# Patient Record
Sex: Male | Born: 1940
Health system: Southern US, Community
[De-identification: ages and names within clinical notes are randomized; demographics above are authoritative.]

## PROBLEM LIST (undated history)

## (undated) DIAGNOSIS — K759 Inflammatory liver disease, unspecified: Secondary | ICD-10-CM

## (undated) DIAGNOSIS — M1990 Unspecified osteoarthritis, unspecified site: Secondary | ICD-10-CM

## (undated) DIAGNOSIS — N281 Cyst of kidney, acquired: Secondary | ICD-10-CM

## (undated) DIAGNOSIS — I10 Essential (primary) hypertension: Secondary | ICD-10-CM

## (undated) DIAGNOSIS — R011 Cardiac murmur, unspecified: Secondary | ICD-10-CM

## (undated) DIAGNOSIS — L509 Urticaria, unspecified: Secondary | ICD-10-CM

## (undated) DIAGNOSIS — K8020 Calculus of gallbladder without cholecystitis without obstruction: Secondary | ICD-10-CM

## (undated) DIAGNOSIS — E039 Hypothyroidism, unspecified: Secondary | ICD-10-CM

## (undated) DIAGNOSIS — Z87442 Personal history of urinary calculi: Secondary | ICD-10-CM

## (undated) DIAGNOSIS — I2510 Atherosclerotic heart disease of native coronary artery without angina pectoris: Secondary | ICD-10-CM

## (undated) DIAGNOSIS — E785 Hyperlipidemia, unspecified: Secondary | ICD-10-CM

## (undated) DIAGNOSIS — K219 Gastro-esophageal reflux disease without esophagitis: Secondary | ICD-10-CM

## (undated) DIAGNOSIS — C9110 Chronic lymphocytic leukemia of B-cell type not having achieved remission: Secondary | ICD-10-CM

## (undated) DIAGNOSIS — K449 Diaphragmatic hernia without obstruction or gangrene: Secondary | ICD-10-CM

## (undated) HISTORY — PX: BACK SURGERY: SHX140

## (undated) HISTORY — DX: Urticaria, unspecified: L50.9

## (undated) HISTORY — DX: Diaphragmatic hernia without obstruction or gangrene: K44.9

## (undated) HISTORY — DX: Calculus of gallbladder without cholecystitis without obstruction: K80.20

## (undated) HISTORY — PX: APPENDECTOMY: SHX54

## (undated) HISTORY — DX: Hyperlipidemia, unspecified: E78.5

---

## 1963-10-02 HISTORY — PX: PILONIDAL CYST EXCISION: SHX744

## 1966-10-01 HISTORY — PX: INCISION AND DRAINAGE ABSCESS ANAL: SUR669

## 2015-05-03 ENCOUNTER — Other Ambulatory Visit: Payer: Self-pay | Admitting: Neurosurgery

## 2015-05-05 ENCOUNTER — Other Ambulatory Visit: Payer: Self-pay | Admitting: Neurosurgery

## 2015-05-05 DIAGNOSIS — N281 Cyst of kidney, acquired: Secondary | ICD-10-CM

## 2015-05-06 ENCOUNTER — Ambulatory Visit
Admission: RE | Admit: 2015-05-06 | Discharge: 2015-05-06 | Disposition: A | Discharge status: Home / Self Care | Payer: Medicare HMO | Source: Ambulatory Visit | Attending: Neurosurgery | Admitting: Neurosurgery

## 2015-05-06 DIAGNOSIS — N281 Cyst of kidney, acquired: Secondary | ICD-10-CM

## 2015-05-09 ENCOUNTER — Encounter (HOSPITAL_COMMUNITY): Payer: Self-pay

## 2015-05-09 ENCOUNTER — Encounter (HOSPITAL_COMMUNITY)
Admission: RE | Admit: 2015-05-09 | Discharge: 2015-05-09 | Disposition: A | Discharge status: Home / Self Care | Payer: Medicare HMO | Source: Ambulatory Visit | Attending: Neurosurgery | Admitting: Neurosurgery

## 2015-05-09 DIAGNOSIS — Z01812 Encounter for preprocedural laboratory examination: Secondary | ICD-10-CM | POA: Diagnosis present

## 2015-05-09 DIAGNOSIS — M4806 Spinal stenosis, lumbar region: Secondary | ICD-10-CM | POA: Insufficient documentation

## 2015-05-09 HISTORY — DX: Essential (primary) hypertension: I10

## 2015-05-09 HISTORY — DX: Cyst of kidney, acquired: N28.1

## 2015-05-09 HISTORY — DX: Hypothyroidism, unspecified: E03.9

## 2015-05-09 HISTORY — DX: Cardiac murmur, unspecified: R01.1

## 2015-05-09 HISTORY — DX: Unspecified osteoarthritis, unspecified site: M19.90

## 2015-05-09 HISTORY — DX: Gastro-esophageal reflux disease without esophagitis: K21.9

## 2015-05-09 HISTORY — DX: Inflammatory liver disease, unspecified: K75.9

## 2015-05-09 LAB — CBC
HEMATOCRIT: 39.7 % (ref 39.0–52.0)
HEMOGLOBIN: 13.6 g/dL (ref 13.0–17.0)
MCH: 30.3 pg (ref 26.0–34.0)
MCHC: 34.3 g/dL (ref 30.0–36.0)
MCV: 88.4 fL (ref 78.0–100.0)
PLATELETS: 216 10*3/uL (ref 150–400)
RBC: 4.49 MIL/uL (ref 4.22–5.81)
RDW: 14 % (ref 11.5–15.5)
WBC: 12.2 10*3/uL — ABNORMAL HIGH (ref 4.0–10.5)

## 2015-05-09 LAB — SURGICAL PCR SCREEN
MRSA, PCR: NEGATIVE
Staphylococcus aureus: NEGATIVE

## 2015-05-09 LAB — BASIC METABOLIC PANEL
Anion gap: 9 (ref 5–15)
BUN: 10 mg/dL (ref 6–20)
CALCIUM: 9.7 mg/dL (ref 8.9–10.3)
CHLORIDE: 104 mmol/L (ref 101–111)
CO2: 26 mmol/L (ref 22–32)
Creatinine, Ser: 1.07 mg/dL (ref 0.61–1.24)
GFR calc Af Amer: >60 mL/min (ref >60)
GLUCOSE: 103 mg/dL — AB (ref 65–99)
POTASSIUM: 4.3 mmol/L (ref 3.5–5.1)
SODIUM: 139 mmol/L (ref 135–145)

## 2015-05-09 NOTE — Progress Notes (Signed)
Sending sleep apnea scoring to Dr. Garlon Hatchet, also requesting EKG that pt. Reports was done in their office 2-3 months ago.

## 2015-05-09 NOTE — Progress Notes (Signed)
   05/09/15 1331  OBSTRUCTIVE SLEEP APNEA  Have you ever been diagnosed with sleep apnea through a sleep study? No  Do you snore loudly (loud enough to be heard through closed doors)?  1  Do you often feel tired, fatigued, or sleepy during the daytime? 0  Has anyone observed you stop breathing during your sleep? 0  Do you have, or are you being treated for high blood pressure? 1  BMI more than 35 kg/m2? 0  Age over 73 years old? 1  Neck circumference greater than 40 cm/16 inches? 0  Gender: 1

## 2015-05-09 NOTE — Pre-Procedure Instructions (Signed)
James Hammond  05/09/2015      Your procedure is scheduled on August 12.  Report to [facility] Admitting at 5:30 A.M.  Call this number if you have problems the morning of surgery:  [phone]   Remember:  Do not eat food or drink liquids after midnight.  Take these medicines the morning of surgery with A SIP OF WATER Gabapentin,    STOP aspirin, ibuprofen today   STOP/ Do not take Aspirin, Aleve, Naproxen, Advil, Ibuprofen, Motrin, Vitamins, Herbs, or Supplements starting today   Do not wear jewelry, make-up or nail polish.  Do not wear lotions, powders, or perfumes.  You may wear deodorant.  Do not shave 48 hours prior to surgery.  Men may shave face and neck.  Do not bring valuables to the hospital.  [facility] is not responsible for any belongings or valuables.  Contacts, dentures or bridgework may not be worn into surgery.  Leave your suitcase in the car.  After surgery it may be brought to your room.  For patients admitted to the hospital, discharge time will be determined by your treatment team.  [facility] - Preparing for Surgery  Before surgery, you can play an important role.  Because skin is not sterile, your skin needs to be as free of germs as possible.  You can reduce the number of germs on you skin by washing with CHG (chlorahexidine gluconate) soap before surgery.  CHG is an antiseptic cleaner which kills germs and bonds with the skin to continue killing germs even after washing.  Please DO NOT use if you have an allergy to CHG or antibacterial soaps.  If your skin becomes reddened/irritated stop using the CHG and inform your nurse when you arrive at Short Stay.  Do not shave (including legs and underarms) for at least 48 hours prior to the first CHG shower.  You may shave your face.  Please follow these instructions carefully:   1.  Shower with CHG Soap the night before surgery and the morning of Surgery.  2.  If you choose to wash your  hair, wash your hair first as usual with your normal shampoo.  3.  After you shampoo, rinse your hair and body thoroughly to remove the shampoo.  4.  Use CHG as you would any other liquid soap.  You can apply CHG directly to the skin and wash gently with scrungie or a clean washcloth.  5.  Apply the CHG Soap to your body ONLY FROM THE NECK DOWN.  Do not use on open wounds or open sores.  Avoid contact with your eyes, ears, mouth and genitals (private parts).  Wash genitals (private parts) with your normal soap.  6.  Wash thoroughly, paying special attention to the area where your surgery will be performed.  7.  Thoroughly rinse your body with warm water from the neck down.  8.  DO NOT shower/wash with your normal soap after using and rinsing off the CHG Soap.  9.  Pat yourself dry with a clean towel.            10.  Wear clean pajamas.            11.  Place clean sheets on your bed the night of your first shower and do not sleep with pets.  Day of Surgery  Do not apply any lotions the morning of surgery.  Please wear clean clothes to the hospital/surgery center.  Please read over the following fact sheets that you were given. Pain Booklet, Coughing and Deep Breathing and Surgical Site Infection Prevention

## 2015-05-12 MED ORDER — CEFAZOLIN SODIUM-DEXTROSE 2-3 GM-% IV SOLR
2.0000 g | INTRAVENOUS | Status: AC
  Administered 2015-05-13: 2 g via INTRAVENOUS
  Filled 2015-05-12: qty 50

## 2015-05-12 MED ORDER — DEXAMETHASONE SODIUM PHOSPHATE 10 MG/ML IJ SOLN
10.0000 mg | INTRAMUSCULAR | Status: AC
  Administered 2015-05-13: 10 mg via INTRAVENOUS
  Filled 2015-05-12: qty 1

## 2015-05-13 ENCOUNTER — Inpatient Hospital Stay (HOSPITAL_COMMUNITY)
Admission: RE | Admit: 2015-05-13 | Discharge: 2015-05-13 | DRG: 520 | Disposition: A | Discharge status: Home / Self Care | Payer: Medicare HMO | Source: Ambulatory Visit | Attending: Neurosurgery | Admitting: Neurosurgery

## 2015-05-13 ENCOUNTER — Inpatient Hospital Stay (HOSPITAL_COMMUNITY): Payer: Medicare HMO

## 2015-05-13 ENCOUNTER — Encounter (HOSPITAL_COMMUNITY): Payer: Self-pay | Admitting: Certified Registered Nurse Anesthetist

## 2015-05-13 ENCOUNTER — Inpatient Hospital Stay (HOSPITAL_COMMUNITY): Payer: Medicare HMO | Admitting: Anesthesiology

## 2015-05-13 ENCOUNTER — Encounter (HOSPITAL_COMMUNITY)
Admission: RE | Disposition: A | Discharge status: Home / Self Care | Payer: Self-pay | Source: Ambulatory Visit | Attending: Neurosurgery

## 2015-05-13 DIAGNOSIS — Z7982 Long term (current) use of aspirin: Secondary | ICD-10-CM

## 2015-05-13 DIAGNOSIS — Z87891 Personal history of nicotine dependence: Secondary | ICD-10-CM | POA: Diagnosis not present

## 2015-05-13 DIAGNOSIS — M4690 Unspecified inflammatory spondylopathy, site unspecified: Secondary | ICD-10-CM | POA: Diagnosis present

## 2015-05-13 DIAGNOSIS — R011 Cardiac murmur, unspecified: Secondary | ICD-10-CM | POA: Diagnosis present

## 2015-05-13 DIAGNOSIS — M5126 Other intervertebral disc displacement, lumbar region: Secondary | ICD-10-CM | POA: Diagnosis present

## 2015-05-13 DIAGNOSIS — E039 Hypothyroidism, unspecified: Secondary | ICD-10-CM | POA: Diagnosis present

## 2015-05-13 DIAGNOSIS — K219 Gastro-esophageal reflux disease without esophagitis: Secondary | ICD-10-CM | POA: Diagnosis present

## 2015-05-13 DIAGNOSIS — M5116 Intervertebral disc disorders with radiculopathy, lumbar region: Secondary | ICD-10-CM | POA: Diagnosis present

## 2015-05-13 DIAGNOSIS — Z79899 Other long term (current) drug therapy: Secondary | ICD-10-CM

## 2015-05-13 DIAGNOSIS — I10 Essential (primary) hypertension: Secondary | ICD-10-CM | POA: Diagnosis present

## 2015-05-13 DIAGNOSIS — M4806 Spinal stenosis, lumbar region: Secondary | ICD-10-CM | POA: Diagnosis present

## 2015-05-13 DIAGNOSIS — Z419 Encounter for procedure for purposes other than remedying health state, unspecified: Secondary | ICD-10-CM

## 2015-05-13 HISTORY — DX: Other intervertebral disc displacement, lumbar region: M51.26

## 2015-05-13 HISTORY — PX: LUMBAR LAMINECTOMY/DECOMPRESSION MICRODISCECTOMY: SHX5026

## 2015-05-13 SURGERY — LUMBAR LAMINECTOMY/DECOMPRESSION MICRODISCECTOMY 2 LEVELS
Anesthesia: General | Site: Back | Laterality: Right

## 2015-05-13 MED ORDER — SODIUM CHLORIDE 0.9 % IJ SOLN: 3.0000 mL | Freq: Two times a day (BID) | INTRAMUSCULAR | Status: DC

## 2015-05-13 MED ORDER — PROPOFOL 10 MG/ML IV BOLUS
INTRAVENOUS | Status: AC
  Filled 2015-05-13: qty 20

## 2015-05-13 MED ORDER — FAMOTIDINE 10 MG PO CHEW: 10.0000 mg | CHEWABLE_TABLET | Freq: Two times a day (BID) | ORAL | Status: DC | PRN

## 2015-05-13 MED ORDER — BUPIVACAINE HCL (PF) 0.25 % IJ SOLN
INTRAMUSCULAR | Status: DC | PRN
  Administered 2015-05-13: 10 mL

## 2015-05-13 MED ORDER — ARTIFICIAL TEARS OP OINT
TOPICAL_OINTMENT | OPHTHALMIC | Status: DC | PRN
  Administered 2015-05-13: 1 via OPHTHALMIC

## 2015-05-13 MED ORDER — SODIUM CHLORIDE 0.9 % IR SOLN
Status: DC | PRN
  Administered 2015-05-13: 07:00:00

## 2015-05-13 MED ORDER — ASPIRIN 81 MG PO CHEW: 81.0000 mg | CHEWABLE_TABLET | Freq: Every day | ORAL | Status: DC

## 2015-05-13 MED ORDER — SUGAMMADEX SODIUM 200 MG/2ML IV SOLN
INTRAVENOUS | Status: AC
  Filled 2015-05-13: qty 2

## 2015-05-13 MED ORDER — FENTANYL CITRATE (PF) 100 MCG/2ML IJ SOLN: 25.0000 ug | INTRAMUSCULAR | Status: DC | PRN

## 2015-05-13 MED ORDER — ACETAMINOPHEN 325 MG PO TABS: 650.0000 mg | ORAL_TABLET | ORAL | Status: DC | PRN

## 2015-05-13 MED ORDER — ONDANSETRON HCL 4 MG/2ML IJ SOLN
INTRAMUSCULAR | Status: DC | PRN
  Administered 2015-05-13: 4 mg via INTRAVENOUS

## 2015-05-13 MED ORDER — PROPOFOL 10 MG/ML IV BOLUS
INTRAVENOUS | Status: DC | PRN
  Administered 2015-05-13: 100 mg via INTRAVENOUS

## 2015-05-13 MED ORDER — ACETAMINOPHEN 650 MG RE SUPP: 650.0000 mg | RECTAL | Status: DC | PRN

## 2015-05-13 MED ORDER — PHENOL 1.4 % MT LIQD: 1.0000 | OROMUCOSAL | Status: DC | PRN

## 2015-05-13 MED ORDER — THROMBIN 5000 UNITS EX SOLR
CUTANEOUS | Status: DC | PRN
  Administered 2015-05-13 (×2): 5000 [IU] via TOPICAL

## 2015-05-13 MED ORDER — 0.9 % SODIUM CHLORIDE (POUR BTL) OPTIME
TOPICAL | Status: DC | PRN
  Administered 2015-05-13: 1000 mL

## 2015-05-13 MED ORDER — CEFAZOLIN SODIUM 1-5 GM-% IV SOLN
1.0000 g | Freq: Three times a day (TID) | INTRAVENOUS | Status: DC
  Administered 2015-05-13: 1 g via INTRAVENOUS
  Filled 2015-05-13 (×2): qty 50

## 2015-05-13 MED ORDER — SODIUM CHLORIDE 0.9 % IJ SOLN: 3.0000 mL | INTRAMUSCULAR | Status: DC | PRN

## 2015-05-13 MED ORDER — CYCLOBENZAPRINE HCL 10 MG PO TABS: 10.0000 mg | ORAL_TABLET | Freq: Three times a day (TID) | ORAL | Status: DC | PRN

## 2015-05-13 MED ORDER — ROCURONIUM BROMIDE 100 MG/10ML IV SOLN
INTRAVENOUS | Status: DC | PRN
  Administered 2015-05-13: 50 mg via INTRAVENOUS

## 2015-05-13 MED ORDER — IRBESARTAN 75 MG PO TABS
37.5000 mg | ORAL_TABLET | Freq: Every day | ORAL | Status: DC
  Filled 2015-05-13 (×2): qty 0.5

## 2015-05-13 MED ORDER — SODIUM CHLORIDE 0.9 % IJ SOLN
INTRAMUSCULAR | Status: AC
  Filled 2015-05-13: qty 40

## 2015-05-13 MED ORDER — EPHEDRINE SULFATE 50 MG/ML IJ SOLN
INTRAMUSCULAR | Status: DC | PRN
  Administered 2015-05-13: 5 mg via INTRAVENOUS
  Administered 2015-05-13: 10 mg via INTRAVENOUS

## 2015-05-13 MED ORDER — MENTHOL 3 MG MT LOZG: 1.0000 | LOZENGE | OROMUCOSAL | Status: DC | PRN

## 2015-05-13 MED ORDER — LIDOCAINE HCL (CARDIAC) 20 MG/ML IV SOLN
INTRAVENOUS | Status: DC | PRN
  Administered 2015-05-13: 100 mg via INTRAVENOUS

## 2015-05-13 MED ORDER — OXYCODONE-ACETAMINOPHEN 5-325 MG PO TABS: 1.0000 | ORAL_TABLET | ORAL | Status: DC | PRN

## 2015-05-13 MED ORDER — ONDANSETRON HCL 4 MG/2ML IJ SOLN: 4.0000 mg | Freq: Once | INTRAMUSCULAR | Status: DC | PRN

## 2015-05-13 MED ORDER — SUCCINYLCHOLINE CHLORIDE 20 MG/ML IJ SOLN
INTRAMUSCULAR | Status: AC
  Filled 2015-05-13: qty 2

## 2015-05-13 MED ORDER — IBUPROFEN 200 MG PO TABS: 400.0000 mg | ORAL_TABLET | Freq: Three times a day (TID) | ORAL | Status: DC | PRN

## 2015-05-13 MED ORDER — ONDANSETRON HCL 4 MG/2ML IJ SOLN: 4.0000 mg | INTRAMUSCULAR | Status: DC | PRN

## 2015-05-13 MED ORDER — LACTATED RINGERS IV SOLN
INTRAVENOUS | Status: DC | PRN
  Administered 2015-05-13 (×2): via INTRAVENOUS

## 2015-05-13 MED ORDER — HYDROMORPHONE HCL 1 MG/ML IJ SOLN: 0.5000 mg | INTRAMUSCULAR | Status: DC | PRN

## 2015-05-13 MED ORDER — ROCURONIUM BROMIDE 50 MG/5ML IV SOLN
INTRAVENOUS | Status: AC
  Filled 2015-05-13: qty 3

## 2015-05-13 MED ORDER — FENTANYL CITRATE (PF) 100 MCG/2ML IJ SOLN
INTRAMUSCULAR | Status: DC | PRN
  Administered 2015-05-13: 100 ug via INTRAVENOUS
  Administered 2015-05-13 (×3): 50 ug via INTRAVENOUS

## 2015-05-13 MED ORDER — GABAPENTIN 600 MG PO TABS: 600.0000 mg | ORAL_TABLET | Freq: Every day | ORAL | Status: DC

## 2015-05-13 MED ORDER — EPHEDRINE SULFATE 50 MG/ML IJ SOLN
INTRAMUSCULAR | Status: AC
  Filled 2015-05-13: qty 2

## 2015-05-13 MED ORDER — SUGAMMADEX SODIUM 500 MG/5ML IV SOLN
INTRAVENOUS | Status: DC | PRN
  Administered 2015-05-13: 185 mg via INTRAVENOUS

## 2015-05-13 MED ORDER — MIDAZOLAM HCL 2 MG/2ML IJ SOLN
INTRAMUSCULAR | Status: AC
  Filled 2015-05-13: qty 4

## 2015-05-13 MED ORDER — FAMOTIDINE 20 MG PO TABS: 20.0000 mg | ORAL_TABLET | Freq: Two times a day (BID) | ORAL | Status: DC | PRN

## 2015-05-13 MED ORDER — FENTANYL CITRATE (PF) 250 MCG/5ML IJ SOLN
INTRAMUSCULAR | Status: AC
  Filled 2015-05-13: qty 5

## 2015-05-13 MED ORDER — HEMOSTATIC AGENTS (NO CHARGE) OPTIME
TOPICAL | Status: DC | PRN
Start: 2015-05-13 — End: 2015-05-13
  Administered 2015-05-13: 1 via TOPICAL

## 2015-05-13 MED ORDER — LIDOCAINE-EPINEPHRINE 1 %-1:100000 IJ SOLN
INTRAMUSCULAR | Status: DC | PRN
  Administered 2015-05-13: 10 mL

## 2015-05-13 MED ORDER — MIDAZOLAM HCL 5 MG/5ML IJ SOLN
INTRAMUSCULAR | Status: DC | PRN
  Administered 2015-05-13: 2 mg via INTRAVENOUS

## 2015-05-13 SURGICAL SUPPLY — 54 items
BAG DECANTER FOR FLEXI CONT (MISCELLANEOUS) ×3 IMPLANT
BENZOIN TINCTURE PRP APPL 2/3 (GAUZE/BANDAGES/DRESSINGS) ×3 IMPLANT
BLADE CLIPPER SURG (BLADE) ×3 IMPLANT
BLADE SURG 11 STRL SS (BLADE) ×3 IMPLANT
BRUSH SCRUB EZ PLAIN DRY (MISCELLANEOUS) ×3 IMPLANT
BUR MATCHSTICK NEURO 3.0 LAGG (BURR) ×3 IMPLANT
BUR PRECISION FLUTE 6.0 (BURR) ×3 IMPLANT
CANISTER SUCT 3000ML PPV (MISCELLANEOUS) ×3 IMPLANT
CLOSURE WOUND 1/2 X4 (GAUZE/BANDAGES/DRESSINGS) ×1
DECANTER SPIKE VIAL GLASS SM (MISCELLANEOUS) ×3 IMPLANT
DRAPE LAPAROTOMY 100X72X124 (DRAPES) ×3 IMPLANT
DRAPE MICROSCOPE LEICA (MISCELLANEOUS) ×3 IMPLANT
DRAPE POUCH INSTRU U-SHP 10X18 (DRAPES) ×3 IMPLANT
DRAPE PROXIMA HALF (DRAPES) IMPLANT
DRAPE SURG 17X23 STRL (DRAPES) ×3 IMPLANT
DRSG OPSITE POSTOP 4X6 (GAUZE/BANDAGES/DRESSINGS) ×3 IMPLANT
DURAPREP 26ML APPLICATOR (WOUND CARE) ×3 IMPLANT
ELECT REM PT RETURN 9FT ADLT (ELECTROSURGICAL) ×3
ELECTRODE REM PT RTRN 9FT ADLT (ELECTROSURGICAL) ×1 IMPLANT
GAUZE SPONGE 4X4 12PLY STRL (GAUZE/BANDAGES/DRESSINGS) ×3 IMPLANT
GAUZE SPONGE 4X4 16PLY XRAY LF (GAUZE/BANDAGES/DRESSINGS) IMPLANT
GLOVE BIO SURGEON STRL SZ8 (GLOVE) ×3 IMPLANT
GLOVE ECLIPSE 7.0 STRL STRAW (GLOVE) ×3 IMPLANT
GLOVE ECLIPSE 7.5 STRL STRAW (GLOVE) IMPLANT
GLOVE EXAM NITRILE LRG STRL (GLOVE) IMPLANT
GLOVE EXAM NITRILE MD LF STRL (GLOVE) IMPLANT
GLOVE EXAM NITRILE XL STR (GLOVE) IMPLANT
GLOVE EXAM NITRILE XS STR PU (GLOVE) IMPLANT
GLOVE INDICATOR 7.5 STRL GRN (GLOVE) ×9 IMPLANT
GLOVE INDICATOR 8.5 STRL (GLOVE) ×3 IMPLANT
GLOVE SURG SS PI 7.0 STRL IVOR (GLOVE) ×3 IMPLANT
GOWN STRL REUS W/ TWL LRG LVL3 (GOWN DISPOSABLE) ×2 IMPLANT
GOWN STRL REUS W/ TWL XL LVL3 (GOWN DISPOSABLE) ×2 IMPLANT
GOWN STRL REUS W/TWL 2XL LVL3 (GOWN DISPOSABLE) IMPLANT
GOWN STRL REUS W/TWL LRG LVL3 (GOWN DISPOSABLE) ×4
GOWN STRL REUS W/TWL XL LVL3 (GOWN DISPOSABLE) ×4
KIT BASIN OR (CUSTOM PROCEDURE TRAY) ×3 IMPLANT
KIT ROOM TURNOVER OR (KITS) ×3 IMPLANT
LIQUID BAND (GAUZE/BANDAGES/DRESSINGS) ×3 IMPLANT
NEEDLE HYPO 22GX1.5 SAFETY (NEEDLE) ×3 IMPLANT
NEEDLE SPNL 22GX3.5 QUINCKE BK (NEEDLE) ×3 IMPLANT
NS IRRIG 1000ML POUR BTL (IV SOLUTION) ×3 IMPLANT
PACK LAMINECTOMY NEURO (CUSTOM PROCEDURE TRAY) ×3 IMPLANT
RUBBERBAND STERILE (MISCELLANEOUS) ×6 IMPLANT
SPONGE SURGIFOAM ABS GEL SZ50 (HEMOSTASIS) ×3 IMPLANT
STRIP CLOSURE SKIN 1/2X4 (GAUZE/BANDAGES/DRESSINGS) ×2 IMPLANT
SUT VIC AB 0 CT1 18XCR BRD8 (SUTURE) ×1 IMPLANT
SUT VIC AB 0 CT1 8-18 (SUTURE) ×2
SUT VIC AB 2-0 CT1 18 (SUTURE) ×3 IMPLANT
SUT VICRYL 4-0 PS2 18IN ABS (SUTURE) ×3 IMPLANT
SYR 20ML ECCENTRIC (SYRINGE) ×3 IMPLANT
TOWEL OR 17X24 6PK STRL BLUE (TOWEL DISPOSABLE) ×3 IMPLANT
TOWEL OR 17X26 10 PK STRL BLUE (TOWEL DISPOSABLE) ×3 IMPLANT
WATER STERILE IRR 1000ML POUR (IV SOLUTION) ×3 IMPLANT

## 2015-05-13 NOTE — Progress Notes (Signed)
Utilization review completed.  

## 2015-05-13 NOTE — Plan of Care (Signed)
Problem: Consults Goal: Diagnosis - Spinal Surgery Outcome: Completed/Met Date Met:  05/13/15 Microdiscectomy

## 2015-05-13 NOTE — Anesthesia Preprocedure Evaluation (Addendum)
Anesthesia Evaluation  Patient identified by MRN, date of birth, ID band Patient awake    Reviewed: Allergy & Precautions, NPO status , Patient's Chart, lab work & pertinent test results  Airway Mallampati: II  TM Distance: >3 FB Neck ROM: Full    Dental  (+) Dental Advisory Given, Upper Dentures, Partial Lower, Edentulous Upper   Pulmonary former smoker,  breath sounds clear to auscultation  Pulmonary exam normal       Cardiovascular hypertension, Pt. on medications Normal cardiovascular examRhythm:Regular Rate:Normal     Neuro/Psych negative neurological ROS     GI/Hepatic GERD-  Medicated,(+) Hepatitis -History of hepatitis as a child   Endo/Other  Hypothyroidism   Renal/GU Renal diseaseKidney cysts     Musculoskeletal  (+) Arthritis -, Osteoarthritis,    Abdominal   Peds  Hematology negative hematology ROS (+)   Anesthesia Other Findings Day of surgery medications reviewed with the patient.  Reproductive/Obstetrics                            Anesthesia Physical Anesthesia Plan  ASA: II  Anesthesia Plan: General   Post-op Pain Management:    Induction: Intravenous  Airway Management Planned: Oral ETT  Additional Equipment:   Intra-op Plan:   Post-operative Plan: Extubation in OR  Informed Consent: I have reviewed the patients History and Physical, chart, labs and discussed the procedure including the risks, benefits and alternatives for the proposed anesthesia with the patient or authorized representative who has indicated his/her understanding and acceptance.   Dental advisory given  Plan Discussed with: CRNA  Anesthesia Plan Comments: (Risks/benefits of general anesthesia discussed with patient including risk of damage to teeth, lips, gum, and tongue, nausea/vomiting, allergic reactions to medications, and the possibility of heart attack, stroke and death.  All patient  questions answered.  Patient wishes to proceed.)        Anesthesia Quick Evaluation

## 2015-05-13 NOTE — Discharge Summary (Signed)
  Physician Discharge Summary  Patient ID: James Hammond MRN: [id number] DOB/AGE: 73-Sep-1942 73 y.o.  Admit date: 05/13/2015 Discharge date: 05/13/2015  Admission Diagnoses: Herniated nucleus pulposis L4-5 lumbar spinal stenosis L3-4  Discharge Diagnoses: Samegood Active Problems:   HNP (herniated nucleus pulposus), lumbar   Discharged Condition: good  Hospital Course: Patient was admitted to the hospital underwent decompressive laminectomy L3-4 and a right-sided hemi-laminectomy discectomy L4-5. Postop patient did very well recovery room for the floor was angling and voiding spontaneous he tolerating regular diet with resumption of his leg pain. Patient is stable for discharge home.  Consults: Significant Diagnostic Studies: Treatments: L4-5 laminectomy discectomy L3-4 decompressive laminectomy on the right Discharge Exam: Blood pressure 154/72, pulse 83, temperature 97.6 F (36.4 C), temperature source Oral, resp. rate 20, height 5\' 7"  (1.702 m), weight 92.534 kg (204 lb), SpO2 98 %. Strength out of 5 wound clean dry and intact  Disposition: Home     Medication List    TAKE these medications        aspirin 81 MG tablet  Take 81 mg by mouth daily.     famotidine 10 MG chewable tablet  Commonly known as:  PEPCID AC  Chew 10 mg by mouth 2 (two) times daily as needed for heartburn.     gabapentin 600 MG tablet  Commonly known as:  NEURONTIN  Take 600 mg by mouth at bedtime.     ibuprofen 200 MG tablet  Commonly known as:  ADVIL,MOTRIN  Take 400 mg by mouth every 8 (eight) hours as needed for mild pain or moderate pain.     oxyCODONE-acetaminophen 5-325 MG per tablet  Commonly known as:  PERCOCET/ROXICET  Take 1-2 tablets by mouth every 4 (four) hours as needed for moderate pain.     valsartan 160 MG tablet  Commonly known as:  DIOVAN  Take 160 mg by mouth daily.           Follow-up Information    Follow up with [facility], MD.   Specialty:  Neurosurgery    Contact information:   [address] [place] [address] [place] [address] [phone]       Signed: Elaina Hoops 05/13/2015, 2:02 PM

## 2015-05-13 NOTE — Progress Notes (Signed)
Patient alert and oriented, mae's well, voiding adequate amount of urine, swallowing without difficulty, no c/o pain. Patient discharged home with family. Script and discharged instructions given to patient. Patient and family stated understanding of d/c instructions given and has an appointment with MD. 

## 2015-05-13 NOTE — Anesthesia Procedure Notes (Signed)
Procedure Name: Intubation Date/Time: 05/13/2015 7:37 AM Performed by: Rogers Blocker Pre-anesthesia Checklist: Patient identified, Timeout performed, Emergency Drugs available, Suction available and Patient being monitored Patient Re-evaluated:Patient Re-evaluated prior to inductionOxygen Delivery Method: Circle system utilized Preoxygenation: Pre-oxygenation with 100% oxygen Intubation Type: IV induction Ventilation: Mask ventilation without difficulty and Oral airway inserted - appropriate to patient size Laryngoscope Size: Mac and 4 Grade View: Grade I Tube type: Oral Tube size: 7.5 mm Number of attempts: 1 Airway Equipment and Method: Stylet Placement Confirmation: ETT inserted through vocal cords under direct vision,  breath sounds checked- equal and bilateral,  CO2 detector and positive ETCO2 Secured at: 21 cm Tube secured with: Tape Dental Injury: Teeth and Oropharynx as per pre-operative assessment

## 2015-05-13 NOTE — Anesthesia Postprocedure Evaluation (Signed)
  Anesthesia Post-op Note  Patient: HJALMER IOVINO  Procedure(s) Performed: Procedure(s): Microdiscectomy - Lumbar three--four  Lumbar four-five - right (Right)  Patient Location: PACU  Anesthesia Type:General  Level of Consciousness: awake, alert  and oriented  Airway and Oxygen Therapy: Patient Spontanous Breathing  Post-op Pain: none  Post-op Assessment: Post-op Vital signs reviewed, Patient's Cardiovascular Status Stable, Respiratory Function Stable, Patent Airway, No signs of Nausea or vomiting and Pain level controlled LLE Motor Response: Purposeful movement, Responds to commands LLE Sensation: No numbness RLE Motor Response: Purposeful movement, Responds to commands RLE Sensation: No numbness      Post-op Vital Signs: Reviewed and stable  Last Vitals:  Filed Vitals:   05/13/15 0945  BP: 134/60  Pulse: 79  Temp:   Resp: 27    Complications: No apparent anesthesia complications

## 2015-05-13 NOTE — Op Note (Signed)
Pre-Operative diagnosis: Right L4 and L5 radiculopathy from lumbar spinal stenosis L3-4 and spinal stenosis and herniated this pulposus L4-5  Postoperative diagnosis: Same  Procedure: Decompressive lumbar laminectomy L3-4 partial medial facetectomy and microscopic foraminotomy of the right L4 nerve root  #2 lumbar laminectomy microdiscectomy L4-5 on the right with microdissection of the right L5 nerve root microscopic discectomy  Surgeon: Dominica Severin Maliik Karner  Asst.: Consuella Lose  Anesthesia: Gen.  EBL: Minimal  History of present illness: Patient is a 74 year old gentleman with progressive worsening back and right leg pain rating down from shin top his foot and big toe. Workup revealed spinal stenosis at L3-4 with lateral recess stenosis of the L4 nerve root and herniated disc process and spinal stenosis at L4-5. Due to patient's failure conservative treatment imaging findings and progression of clinical syndrome I recommended D compressive laminectomy L3-4 and microscopic discectomy L4-5. I extensively went over the risks and benefits of the operation with the patient as well as perioperative course expectations of outcome and alternatives of surgery he understands and agrees to proceed forward.  Operative procedure: Patient brought into the or was induced under general anesthesia positioned prone the Wilson frame his back was prepped and draped in routine sterile fashion preoperative x-ray localize the appropriate level so after infiltration of 10 mL lidocaine with epi a midline incision was made and Bovie light cautery was used to calcification subperiosteal dissection carried lamina of L3, L4, and L5. Interoperative x-ray identified the 34 disc space so using a high-speed drill the inferior aspect of the lamina of L3 medial facet complex super aspect of the lamina of L4 and the inferior aspect of lamina Laforce medial facet complex super aspect of the lamina of L5 was drilled down. Laminotomy was  begun initially at L4-5 ligament was identified and removed in piecemeal fashion I drilled down to the medial aspect of the pedicle and medial facet complex and then worked my way up to L3-4 and a similar fashion a laminotomy was created ligament flavum was removed piecemeal fashion large bone spur coming off the medial facet complex was identified and removed in piecemeal fashion. Then under Mike's cup illumination under biting the medial facet gutter allowed identification of proximal takeoff of the L4 nerve root at the L3-4 interspace. This was decompressed and unroofed. The space was inspected and felt not to be causing significant amount stenosis not ruptured and minimally bulging. So after adequate foraminotomies been achieved and the L4 nerve root this is packed with Gelfoam and attention was then taken L4-5. Again under Mike's cup illumination under biting the medial facet complex and unroofing of the L5 foramen allowed indication of the L5 nerve root dissected off of a large free fragment disc herniation stuck to the undersurface of the 5 root. This was teased with a nerve hook disc spaces identified and epidural veins are disc space was incised and cleanout pituitary rongeurs. At the or discectomy was no further stenosis of the L5 and L4 foramen were widely patent easily excepting a coronary.and hockey-stick was in to proceed irrigated meticulous hemostasis was maintained Gelfoam was overlaid top of the dura and the muscle fascia proximal in layers with interrupted Vicryls and a running 4 subcuticular Dermabond benzo and Steri-Strips applied patient recovered in stable condition. At the end of case all needle counts sponge counts were correct.

## 2015-05-13 NOTE — Discharge Instructions (Signed)
Wound Care Keep incision clean dry and intact.  May remove the outer dressing in 3 or 4 days leave the Steri-Strip on and intact  cover the Steri-Strips with saran wrap for showers only.  Activity Walk each and every day, increasing distance each day. No lifting greater than 5 lbs.  No lifting no bending no twisting no driving or riding a car unless coming back and forth to see me.  Diet Resume your normal diet.   Call Your Doctor If Any of These Occur Redness, drainage, or swelling at the wound.  Temperature greater than 101 degrees. Severe pain not relieved by pain medication. Incision starts to come apart. Follow Up Appt Call today for appointment in 1-2 weeks ([phone]) or for problems.  If you have any hardware placed in your spine, you will need an x-ray before your appointment.

## 2015-05-13 NOTE — Transfer of Care (Signed)
Immediate Anesthesia Transfer of Care Note  Patient: James Hammond  Procedure(s) Performed: Procedure(s): Microdiscectomy - Lumbar three--four  Lumbar four-five - right (Right)  Patient Location: PACU  Anesthesia Type:General  Level of Consciousness: awake, alert , oriented and patient cooperative  Airway & Oxygen Therapy: Patient Spontanous Breathing and Patient connected to face mask oxygen  Post-op Assessment: Report given to RN, Post -op Vital signs reviewed and stable and Patient moving all extremities  Post vital signs: Reviewed and stable  Last Vitals:  Filed Vitals:   05/13/15 0624  BP: 180/65  Pulse: 74  Temp: 36.7 C  Resp: 20    Complications: No apparent anesthesia complications

## 2015-05-13 NOTE — H&P (Signed)
James Hammond is an 74 y.o. male.   Chief Complaint: Back and right leg pain HPI: Patient is a very pleasant 74 year old some is a progress worsening back and right leg pain rating down posterior thigh posterior lateral calf to his ankle. Workup has revealed severe spinal stenosis at L3-4 and L4-5. Due to patient's failure conservative treatment imaging findings and progression of clinical syndrome I recommended right-sided decompressive laminotomies at L3-4 and L4-5. I've extensively reviewed the risks and benefits of the operation with the patient as well as perioperative course expectations of outcome and alternatives of surgery and he understands and agrees to proceed forward.  Past Medical History  Diagnosis Date  . Rectal abscess 1968  . Pilonidal cyst   . Hypertension   . Heart murmur     told recently that he has a slight murmur   . Hypothyroidism     doesn't need treatment at [facility] s point   . GERD (gastroesophageal reflux disease)   . Kidney cysts   . Arthritis     back   . Hepatitis     as a child     Past Surgical History  Procedure Laterality Date  . Pilonidal cyst excision      required 30 day hosp. due to  (infection) being in the [employer] [place]   . Appendectomy      74 y.o.  . Rectal abscess  1960's    History reviewed. No pertinent family history. Social History:  reports that he quit smoking about 32 years ago. He does not have any smokeless tobacco history on file. He reports that he does not drink alcohol or use illicit drugs.  Allergies: No Known Allergies  Medications Prior to Admission  Medication Sig Dispense Refill  . aspirin 81 MG tablet Take 81 mg by mouth daily.    Marland Kitchen gabapentin (NEURONTIN) 600 MG tablet Take 600 mg by mouth at bedtime.    Marland Kitchen ibuprofen (ADVIL,MOTRIN) 200 MG tablet Take 400 mg by mouth every 8 (eight) hours as needed for mild pain or moderate pain.    . valsartan (DIOVAN) 160 MG tablet Take 160 mg by mouth daily.     . famotidine (PEPCID AC) 10 MG chewable tablet Chew 10 mg by mouth 2 (two) times daily as needed for heartburn.      No results found for this or any previous visit (from the past 48 hour(s)). No results found.  Review of Systems  Constitutional: Negative.   HENT: Negative.   Eyes: Negative.   Respiratory: Negative.   Cardiovascular: Negative.   Gastrointestinal: Negative.   Genitourinary: Negative.   Musculoskeletal: Positive for myalgias and back pain.  Skin: Negative.   Psychiatric/Behavioral: Negative.     Blood pressure 180/65, pulse 74, temperature 98.1 F (36.7 C), temperature source Oral, resp. rate 20, height 5\' 7"  (1.702 m), weight 92.534 kg (204 lb), SpO2 100 %. Physical Exam  Constitutional: He is oriented to person, place, and time. He appears well-developed and well-nourished.  HENT:  Head: Normocephalic.  Eyes: Pupils are equal, round, and reactive to light.  Neck: Normal range of motion.  Respiratory: Effort normal.  GI: Soft. Bowel sounds are normal.  Neurological: He is alert and oriented to person, place, and time. He has normal strength. GCS eye subscore is 4. GCS verbal subscore is 5. GCS motor subscore is 6.  Strength is 5 out of 5 in his iliopsoas, quads, hip she's, gastric, and tibialis, and EHL.  Assessment/Plan 74 year old presents for right-sided laminotomies L3-4 L4-5  Paytan Recine P 05/13/2015, 7:22 AM

## 2015-05-16 ENCOUNTER — Encounter (HOSPITAL_COMMUNITY): Payer: Self-pay | Admitting: Neurosurgery

## 2015-12-14 DIAGNOSIS — E782 Mixed hyperlipidemia: Secondary | ICD-10-CM

## 2015-12-14 DIAGNOSIS — E038 Other specified hypothyroidism: Secondary | ICD-10-CM | POA: Insufficient documentation

## 2015-12-14 DIAGNOSIS — E039 Hypothyroidism, unspecified: Secondary | ICD-10-CM | POA: Insufficient documentation

## 2015-12-14 DIAGNOSIS — I10 Essential (primary) hypertension: Secondary | ICD-10-CM | POA: Insufficient documentation

## 2015-12-14 HISTORY — DX: Mixed hyperlipidemia: E78.2

## 2015-12-14 HISTORY — DX: Other specified hypothyroidism: E03.8

## 2015-12-14 HISTORY — DX: Essential (primary) hypertension: I10

## 2015-12-15 DIAGNOSIS — R011 Cardiac murmur, unspecified: Secondary | ICD-10-CM | POA: Insufficient documentation

## 2015-12-15 HISTORY — DX: Cardiac murmur, unspecified: R01.1

## 2016-05-10 DIAGNOSIS — E039 Hypothyroidism, unspecified: Secondary | ICD-10-CM | POA: Diagnosis not present

## 2016-05-19 DIAGNOSIS — I10 Essential (primary) hypertension: Secondary | ICD-10-CM | POA: Diagnosis not present

## 2016-05-19 DIAGNOSIS — R011 Cardiac murmur, unspecified: Secondary | ICD-10-CM | POA: Diagnosis not present

## 2016-05-31 DIAGNOSIS — R011 Cardiac murmur, unspecified: Secondary | ICD-10-CM | POA: Diagnosis not present

## 2016-05-31 DIAGNOSIS — K219 Gastro-esophageal reflux disease without esophagitis: Secondary | ICD-10-CM | POA: Diagnosis not present

## 2016-05-31 DIAGNOSIS — I10 Essential (primary) hypertension: Secondary | ICD-10-CM | POA: Diagnosis not present

## 2016-05-31 DIAGNOSIS — J3089 Other allergic rhinitis: Secondary | ICD-10-CM | POA: Diagnosis not present

## 2016-05-31 DIAGNOSIS — E039 Hypothyroidism, unspecified: Secondary | ICD-10-CM | POA: Diagnosis not present

## 2016-06-01 HISTORY — PX: CORONARY ARTERY BYPASS GRAFT: SHX141

## 2016-06-01 HISTORY — PX: CARDIAC CATHETERIZATION: SHX172

## 2016-06-12 DIAGNOSIS — Z79899 Other long term (current) drug therapy: Secondary | ICD-10-CM | POA: Diagnosis not present

## 2016-06-12 DIAGNOSIS — Z87891 Personal history of nicotine dependence: Secondary | ICD-10-CM | POA: Diagnosis not present

## 2016-06-12 DIAGNOSIS — R0789 Other chest pain: Secondary | ICD-10-CM | POA: Diagnosis not present

## 2016-06-12 DIAGNOSIS — K219 Gastro-esophageal reflux disease without esophagitis: Secondary | ICD-10-CM | POA: Diagnosis not present

## 2016-06-12 DIAGNOSIS — R079 Chest pain, unspecified: Secondary | ICD-10-CM | POA: Diagnosis not present

## 2016-06-12 DIAGNOSIS — E7800 Pure hypercholesterolemia, unspecified: Secondary | ICD-10-CM | POA: Diagnosis not present

## 2016-06-12 DIAGNOSIS — I249 Acute ischemic heart disease, unspecified: Secondary | ICD-10-CM | POA: Diagnosis not present

## 2016-06-12 DIAGNOSIS — I10 Essential (primary) hypertension: Secondary | ICD-10-CM | POA: Diagnosis not present

## 2016-06-12 DIAGNOSIS — I160 Hypertensive urgency: Secondary | ICD-10-CM | POA: Diagnosis not present

## 2016-06-12 DIAGNOSIS — N289 Disorder of kidney and ureter, unspecified: Secondary | ICD-10-CM | POA: Diagnosis not present

## 2016-06-12 DIAGNOSIS — E039 Hypothyroidism, unspecified: Secondary | ICD-10-CM | POA: Diagnosis not present

## 2016-06-12 DIAGNOSIS — I214 Non-ST elevation (NSTEMI) myocardial infarction: Secondary | ICD-10-CM | POA: Insufficient documentation

## 2016-06-12 DIAGNOSIS — Z7982 Long term (current) use of aspirin: Secondary | ICD-10-CM | POA: Diagnosis not present

## 2016-06-12 HISTORY — DX: Non-ST elevation (NSTEMI) myocardial infarction: I21.4

## 2016-06-13 DIAGNOSIS — E784 Other hyperlipidemia: Secondary | ICD-10-CM | POA: Diagnosis not present

## 2016-06-13 DIAGNOSIS — I119 Hypertensive heart disease without heart failure: Secondary | ICD-10-CM | POA: Diagnosis not present

## 2016-06-13 DIAGNOSIS — I249 Acute ischemic heart disease, unspecified: Secondary | ICD-10-CM | POA: Diagnosis not present

## 2016-06-13 DIAGNOSIS — K219 Gastro-esophageal reflux disease without esophagitis: Secondary | ICD-10-CM | POA: Diagnosis not present

## 2016-06-13 DIAGNOSIS — R079 Chest pain, unspecified: Secondary | ICD-10-CM | POA: Diagnosis not present

## 2016-06-13 DIAGNOSIS — I350 Nonrheumatic aortic (valve) stenosis: Secondary | ICD-10-CM | POA: Diagnosis not present

## 2016-06-14 DIAGNOSIS — I214 Non-ST elevation (NSTEMI) myocardial infarction: Secondary | ICD-10-CM | POA: Diagnosis not present

## 2016-06-14 DIAGNOSIS — Z87891 Personal history of nicotine dependence: Secondary | ICD-10-CM | POA: Diagnosis not present

## 2016-06-14 DIAGNOSIS — Z79899 Other long term (current) drug therapy: Secondary | ICD-10-CM | POA: Diagnosis not present

## 2016-06-14 DIAGNOSIS — I440 Atrioventricular block, first degree: Secondary | ICD-10-CM | POA: Diagnosis not present

## 2016-06-14 DIAGNOSIS — I25119 Atherosclerotic heart disease of native coronary artery with unspecified angina pectoris: Secondary | ICD-10-CM | POA: Diagnosis not present

## 2016-06-14 DIAGNOSIS — R9430 Abnormal result of cardiovascular function study, unspecified: Secondary | ICD-10-CM | POA: Diagnosis not present

## 2016-06-14 DIAGNOSIS — I700 Atherosclerosis of aorta: Secondary | ICD-10-CM | POA: Diagnosis not present

## 2016-06-14 DIAGNOSIS — J9811 Atelectasis: Secondary | ICD-10-CM | POA: Diagnosis not present

## 2016-06-14 DIAGNOSIS — Z7982 Long term (current) use of aspirin: Secondary | ICD-10-CM | POA: Diagnosis not present

## 2016-06-14 DIAGNOSIS — E785 Hyperlipidemia, unspecified: Secondary | ICD-10-CM | POA: Diagnosis not present

## 2016-06-14 DIAGNOSIS — I200 Unstable angina: Secondary | ICD-10-CM | POA: Diagnosis not present

## 2016-06-14 DIAGNOSIS — T50995A Adverse effect of other drugs, medicaments and biological substances, initial encounter: Secondary | ICD-10-CM | POA: Diagnosis not present

## 2016-06-14 DIAGNOSIS — I249 Acute ischemic heart disease, unspecified: Secondary | ICD-10-CM | POA: Diagnosis not present

## 2016-06-14 DIAGNOSIS — I2510 Atherosclerotic heart disease of native coronary artery without angina pectoris: Secondary | ICD-10-CM | POA: Diagnosis not present

## 2016-06-14 DIAGNOSIS — E784 Other hyperlipidemia: Secondary | ICD-10-CM | POA: Diagnosis not present

## 2016-06-14 DIAGNOSIS — N289 Disorder of kidney and ureter, unspecified: Secondary | ICD-10-CM | POA: Diagnosis not present

## 2016-06-14 DIAGNOSIS — I25118 Atherosclerotic heart disease of native coronary artery with other forms of angina pectoris: Secondary | ICD-10-CM | POA: Diagnosis not present

## 2016-06-14 DIAGNOSIS — I499 Cardiac arrhythmia, unspecified: Secondary | ICD-10-CM | POA: Diagnosis not present

## 2016-06-14 DIAGNOSIS — Z951 Presence of aortocoronary bypass graft: Secondary | ICD-10-CM | POA: Diagnosis not present

## 2016-06-14 DIAGNOSIS — E7800 Pure hypercholesterolemia, unspecified: Secondary | ICD-10-CM | POA: Diagnosis not present

## 2016-06-14 DIAGNOSIS — E038 Other specified hypothyroidism: Secondary | ICD-10-CM | POA: Diagnosis not present

## 2016-06-14 DIAGNOSIS — I083 Combined rheumatic disorders of mitral, aortic and tricuspid valves: Secondary | ICD-10-CM | POA: Diagnosis not present

## 2016-06-14 DIAGNOSIS — R112 Nausea with vomiting, unspecified: Secondary | ICD-10-CM | POA: Diagnosis not present

## 2016-06-14 DIAGNOSIS — J939 Pneumothorax, unspecified: Secondary | ICD-10-CM | POA: Diagnosis not present

## 2016-06-14 DIAGNOSIS — I119 Hypertensive heart disease without heart failure: Secondary | ICD-10-CM | POA: Diagnosis not present

## 2016-06-14 DIAGNOSIS — K219 Gastro-esophageal reflux disease without esophagitis: Secondary | ICD-10-CM | POA: Diagnosis not present

## 2016-06-14 DIAGNOSIS — E039 Hypothyroidism, unspecified: Secondary | ICD-10-CM | POA: Diagnosis not present

## 2016-06-14 DIAGNOSIS — I10 Essential (primary) hypertension: Secondary | ICD-10-CM | POA: Diagnosis not present

## 2016-06-18 DIAGNOSIS — I25110 Atherosclerotic heart disease of native coronary artery with unstable angina pectoris: Secondary | ICD-10-CM | POA: Insufficient documentation

## 2016-07-05 DIAGNOSIS — E782 Mixed hyperlipidemia: Secondary | ICD-10-CM | POA: Diagnosis not present

## 2016-07-05 DIAGNOSIS — Z951 Presence of aortocoronary bypass graft: Secondary | ICD-10-CM | POA: Diagnosis not present

## 2016-07-05 DIAGNOSIS — I25110 Atherosclerotic heart disease of native coronary artery with unstable angina pectoris: Secondary | ICD-10-CM | POA: Diagnosis not present

## 2016-07-05 DIAGNOSIS — I10 Essential (primary) hypertension: Secondary | ICD-10-CM | POA: Diagnosis not present

## 2016-08-16 DIAGNOSIS — I10 Essential (primary) hypertension: Secondary | ICD-10-CM | POA: Diagnosis not present

## 2016-08-16 DIAGNOSIS — I25110 Atherosclerotic heart disease of native coronary artery with unstable angina pectoris: Secondary | ICD-10-CM | POA: Diagnosis not present

## 2016-08-16 DIAGNOSIS — E782 Mixed hyperlipidemia: Secondary | ICD-10-CM | POA: Diagnosis not present

## 2016-08-16 DIAGNOSIS — Z951 Presence of aortocoronary bypass graft: Secondary | ICD-10-CM | POA: Diagnosis not present

## 2016-09-11 DIAGNOSIS — E039 Hypothyroidism, unspecified: Secondary | ICD-10-CM | POA: Diagnosis not present

## 2016-09-11 DIAGNOSIS — R739 Hyperglycemia, unspecified: Secondary | ICD-10-CM | POA: Diagnosis not present

## 2016-09-11 DIAGNOSIS — E785 Hyperlipidemia, unspecified: Secondary | ICD-10-CM | POA: Diagnosis not present

## 2016-09-11 DIAGNOSIS — I2510 Atherosclerotic heart disease of native coronary artery without angina pectoris: Secondary | ICD-10-CM | POA: Diagnosis not present

## 2016-09-11 DIAGNOSIS — I10 Essential (primary) hypertension: Secondary | ICD-10-CM | POA: Diagnosis not present

## 2016-09-11 DIAGNOSIS — K219 Gastro-esophageal reflux disease without esophagitis: Secondary | ICD-10-CM | POA: Diagnosis not present

## 2016-09-11 DIAGNOSIS — Z79899 Other long term (current) drug therapy: Secondary | ICD-10-CM | POA: Diagnosis not present

## 2016-10-18 IMAGING — US US RENAL
1 series · 14 of 25 positions shown · non-contrast
Comparison: None.

CLINICAL DATA: Renal cyst.

EXAM:
RENAL / URINARY TRACT ULTRASOUND COMPLETE

[Series 1: us renal · 0.30mm/px · 14 of 45 slices shown]
[im 1/45]
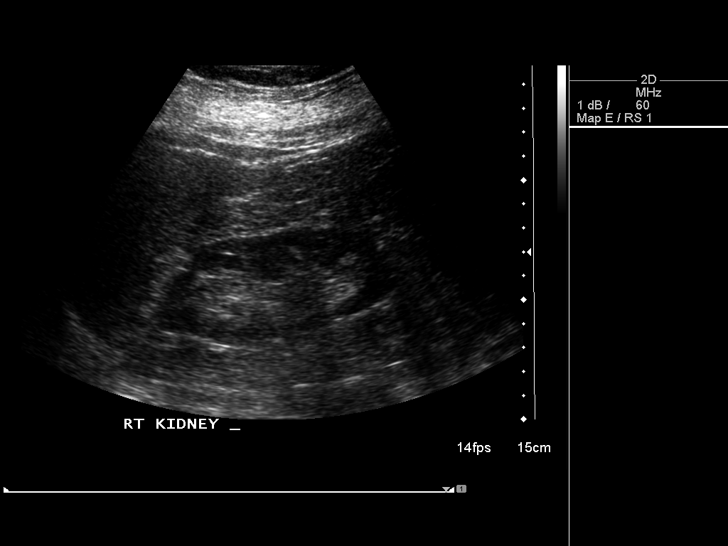
[im 4/45]
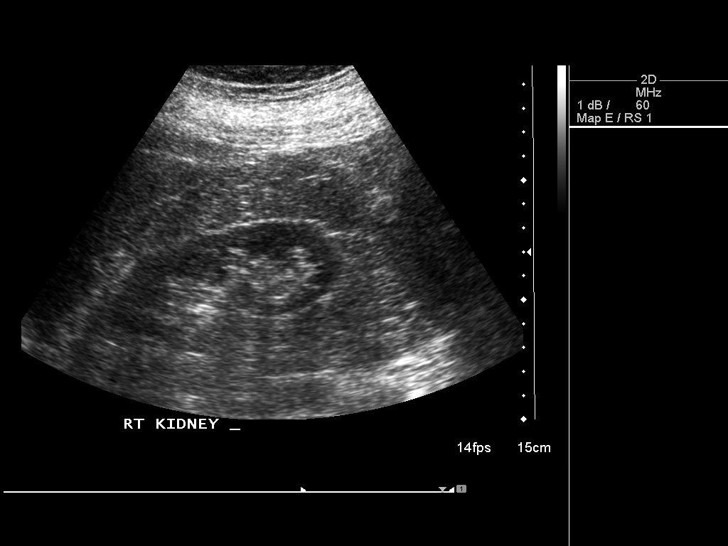
[im 8/45]
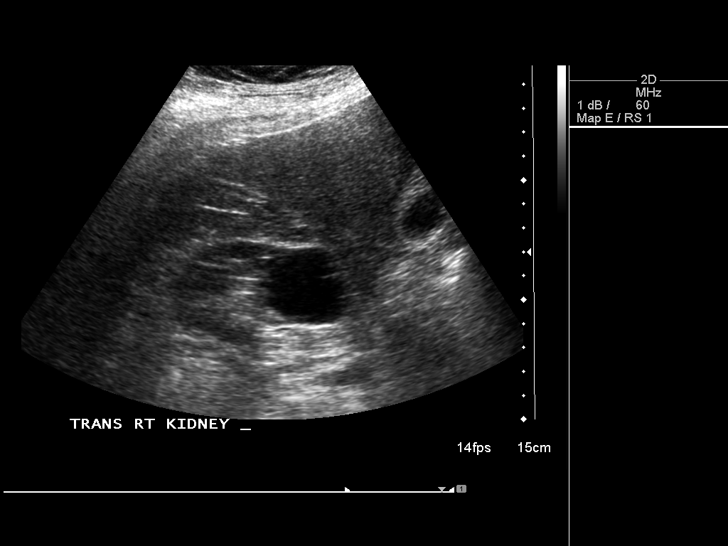
[im 12/45]
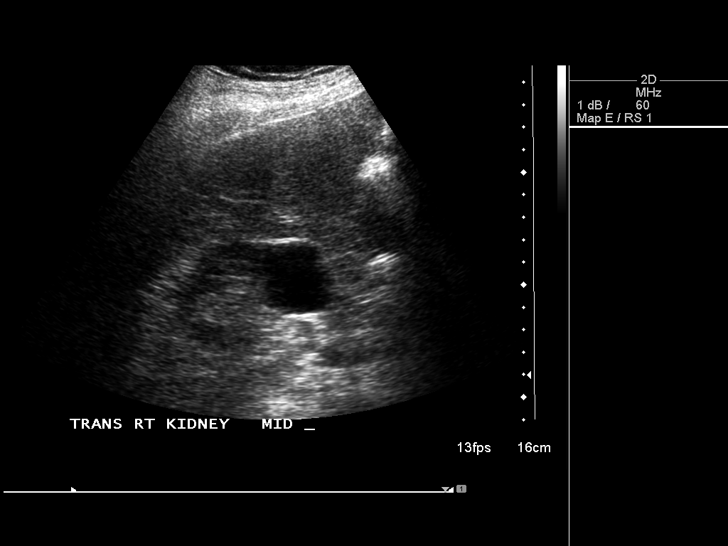
[im 15/45]
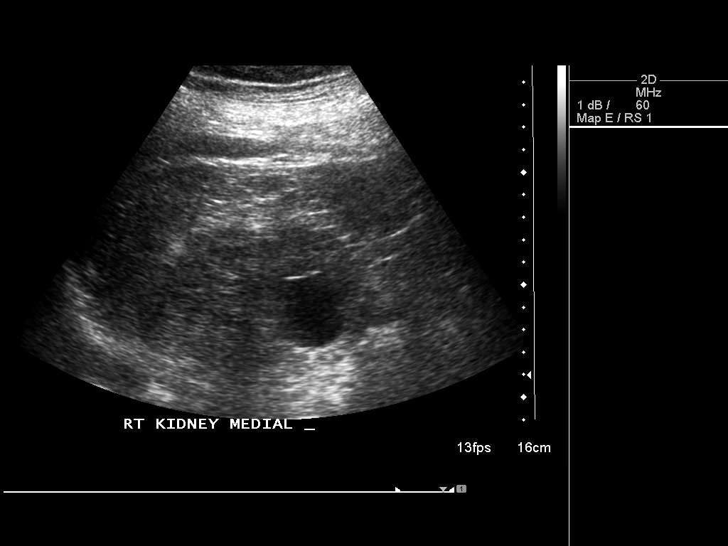
[im 17/45]
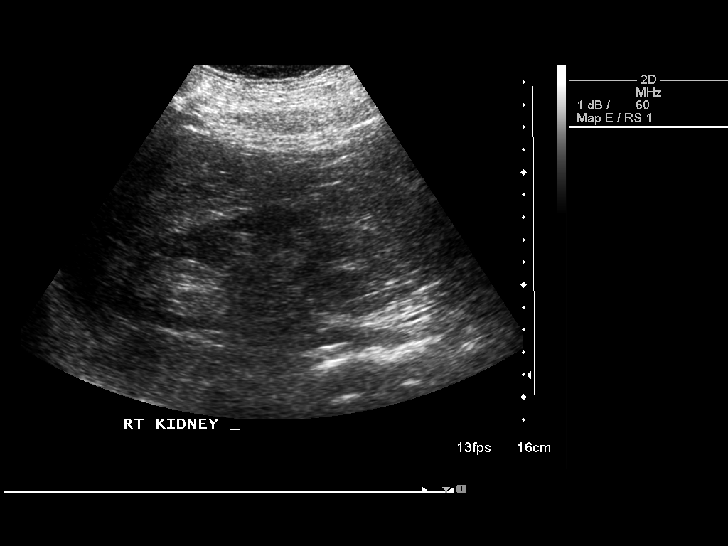
[im 21/45]
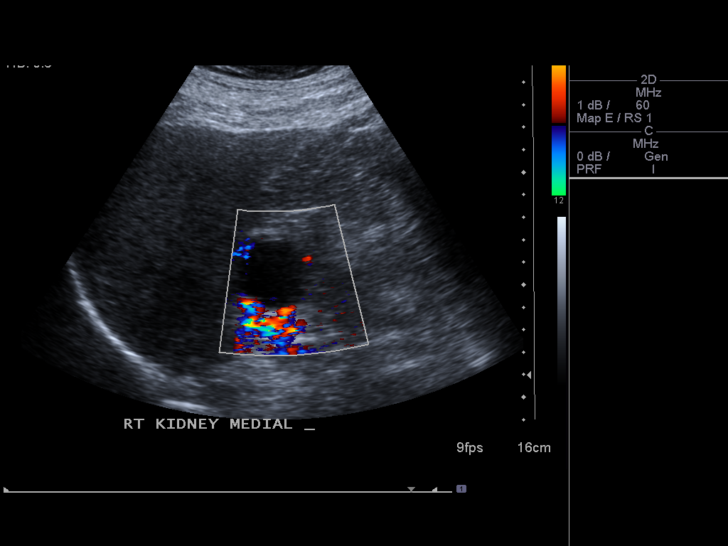
[im 24/45]
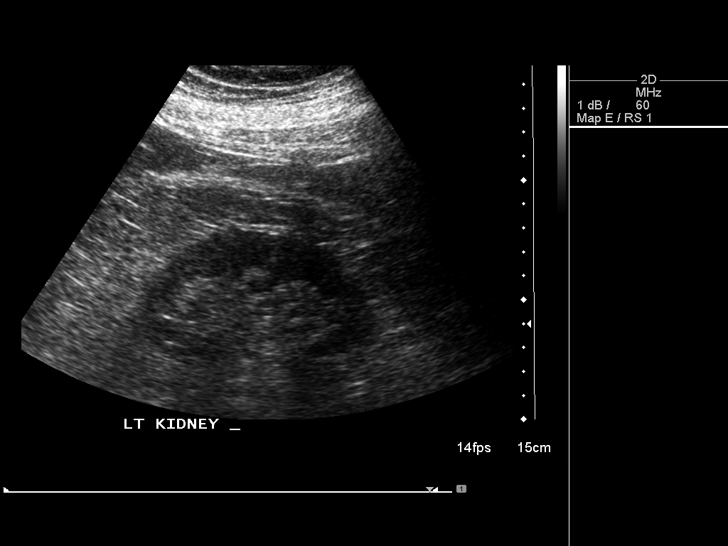
[im 28/45]
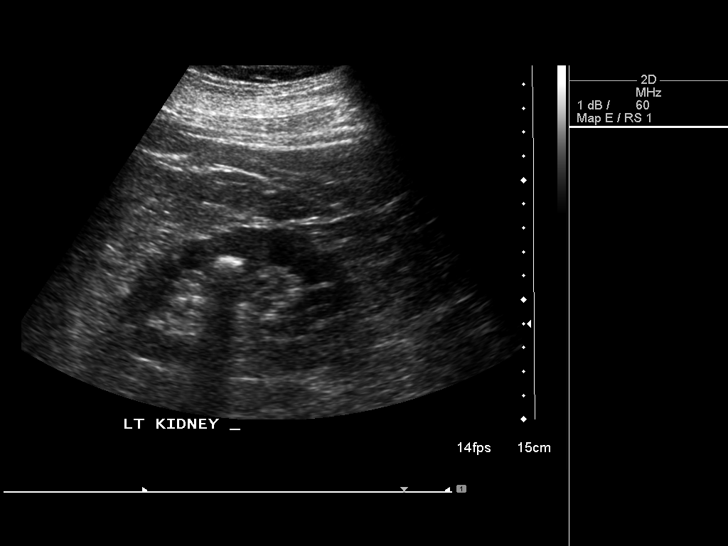
[im 30/45]
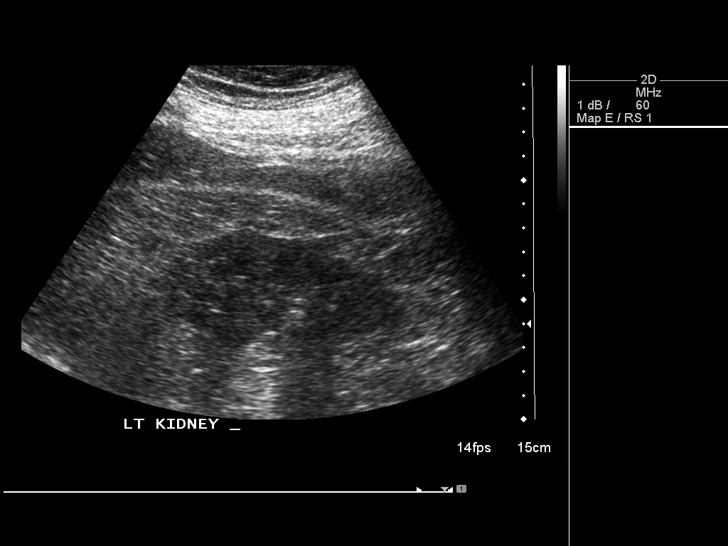
[im 34/45]
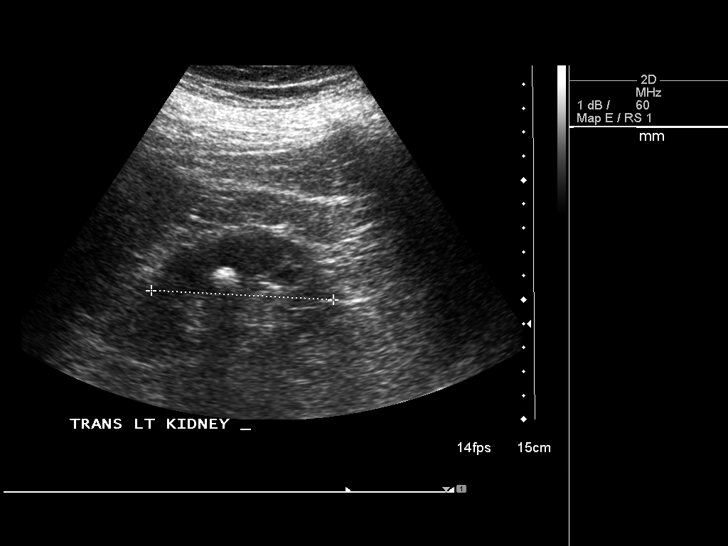
[im 37/45]
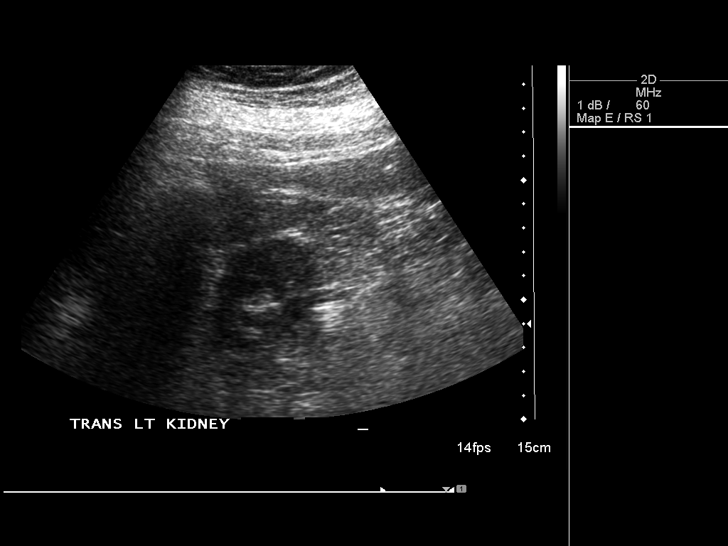
[im 41/45]
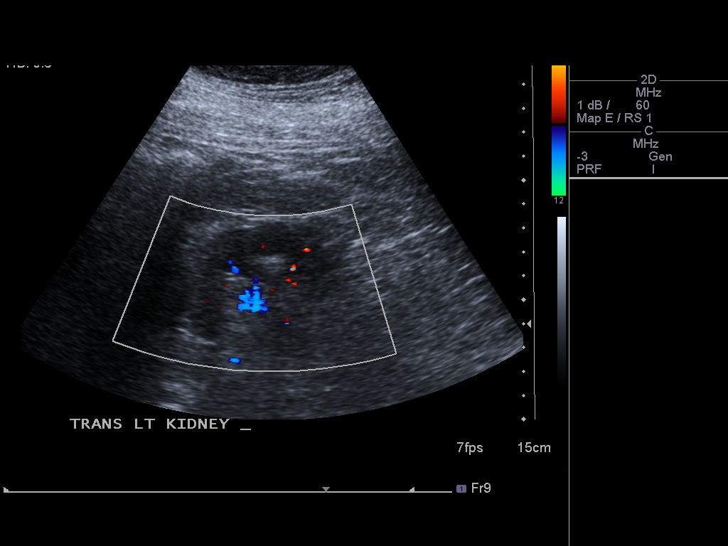
[im 45/45]
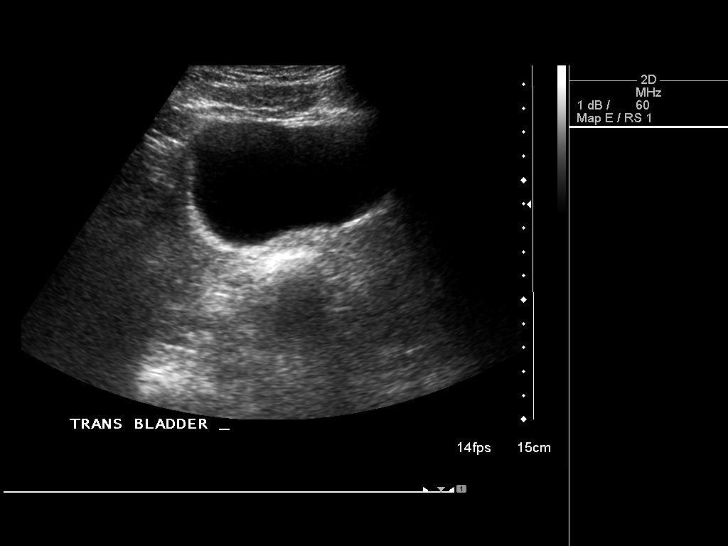

[14 of 25 positions shown; findings below may reference images not displayed]

FINDINGS: Right Kidney:

Length: 10.6 cm. Echogenicity within normal limits. 4.6 cm simple
cyst upper portion the right kidney. No hydronephrosis.

Left Kidney:

Length: 10.6 cm. Echogenicity within normal limits. No mass or
hydronephrosis visualized. 1.4 cm left renal stone.

Bladder:

Appears normal for degree of bladder distention.
IMPRESSION: 1. 4.6 cm simple cyst right kidney.
2. 1.4 cm left renal nonobstructing caliceal stone.

## 2016-11-22 DIAGNOSIS — I10 Essential (primary) hypertension: Secondary | ICD-10-CM | POA: Diagnosis not present

## 2016-11-22 DIAGNOSIS — I25110 Atherosclerotic heart disease of native coronary artery with unstable angina pectoris: Secondary | ICD-10-CM | POA: Diagnosis not present

## 2016-11-22 DIAGNOSIS — Z951 Presence of aortocoronary bypass graft: Secondary | ICD-10-CM | POA: Diagnosis not present

## 2016-11-22 DIAGNOSIS — I739 Peripheral vascular disease, unspecified: Secondary | ICD-10-CM

## 2016-11-22 DIAGNOSIS — E782 Mixed hyperlipidemia: Secondary | ICD-10-CM | POA: Diagnosis not present

## 2016-11-22 HISTORY — DX: Peripheral vascular disease, unspecified: I73.9

## 2016-12-06 DIAGNOSIS — I25110 Atherosclerotic heart disease of native coronary artery with unstable angina pectoris: Secondary | ICD-10-CM | POA: Diagnosis not present

## 2016-12-06 DIAGNOSIS — I739 Peripheral vascular disease, unspecified: Secondary | ICD-10-CM | POA: Diagnosis not present

## 2016-12-06 DIAGNOSIS — E782 Mixed hyperlipidemia: Secondary | ICD-10-CM | POA: Diagnosis not present

## 2016-12-06 DIAGNOSIS — I10 Essential (primary) hypertension: Secondary | ICD-10-CM | POA: Diagnosis not present

## 2016-12-06 DIAGNOSIS — Z951 Presence of aortocoronary bypass graft: Secondary | ICD-10-CM | POA: Diagnosis not present

## 2016-12-24 DIAGNOSIS — E039 Hypothyroidism, unspecified: Secondary | ICD-10-CM | POA: Diagnosis not present

## 2016-12-24 DIAGNOSIS — I10 Essential (primary) hypertension: Secondary | ICD-10-CM | POA: Diagnosis not present

## 2016-12-24 DIAGNOSIS — K219 Gastro-esophageal reflux disease without esophagitis: Secondary | ICD-10-CM | POA: Diagnosis not present

## 2016-12-24 DIAGNOSIS — Z0000 Encounter for general adult medical examination without abnormal findings: Secondary | ICD-10-CM | POA: Diagnosis not present

## 2016-12-24 DIAGNOSIS — E785 Hyperlipidemia, unspecified: Secondary | ICD-10-CM | POA: Diagnosis not present

## 2016-12-24 DIAGNOSIS — Z79899 Other long term (current) drug therapy: Secondary | ICD-10-CM | POA: Diagnosis not present

## 2016-12-24 DIAGNOSIS — Z125 Encounter for screening for malignant neoplasm of prostate: Secondary | ICD-10-CM | POA: Diagnosis not present

## 2016-12-24 DIAGNOSIS — R739 Hyperglycemia, unspecified: Secondary | ICD-10-CM | POA: Diagnosis not present

## 2017-01-03 DIAGNOSIS — Z1211 Encounter for screening for malignant neoplasm of colon: Secondary | ICD-10-CM | POA: Diagnosis not present

## 2017-01-27 DIAGNOSIS — R0981 Nasal congestion: Secondary | ICD-10-CM | POA: Diagnosis not present

## 2017-01-27 DIAGNOSIS — J069 Acute upper respiratory infection, unspecified: Secondary | ICD-10-CM | POA: Diagnosis not present

## 2017-02-12 DIAGNOSIS — T7840XA Allergy, unspecified, initial encounter: Secondary | ICD-10-CM | POA: Diagnosis not present

## 2017-02-26 DIAGNOSIS — T7840XD Allergy, unspecified, subsequent encounter: Secondary | ICD-10-CM | POA: Diagnosis not present

## 2017-03-27 ENCOUNTER — Ambulatory Visit (INDEPENDENT_AMBULATORY_CARE_PROVIDER_SITE_OTHER): Payer: Medicare Other | Admitting: Allergy and Immunology

## 2017-03-27 ENCOUNTER — Encounter: Payer: Self-pay | Admitting: Allergy and Immunology

## 2017-03-27 VITALS — BP 140/68 | HR 60 | Temp 98.5°F | Resp 16 | Ht 66.0 in | Wt 199.0 lb

## 2017-03-27 DIAGNOSIS — L500 Allergic urticaria: Secondary | ICD-10-CM | POA: Diagnosis not present

## 2017-03-27 DIAGNOSIS — J3089 Other allergic rhinitis: Secondary | ICD-10-CM | POA: Diagnosis not present

## 2017-03-27 NOTE — Progress Notes (Signed)
Dear Dr. Venetia Maxon,  Thank you for referring James Hammond to the [facility] on 03/27/2017.   Below is a summation of this patient's evaluation and recommendations.  Thank you for your referral. I will keep you informed about this patient's response to treatment.   If you have any questions please do not hesitate to contact me.   Sincerely,  Jiles Prows, MD Allergy / Immunology [place]   ______________________________________________________________________    NEW PATIENT NOTE  Referring Provider: Street, [name], * Primary Provider: Street, [name], MD Date of office visit: 03/27/2017    Subjective:   Chief Complaint:  James Hammond (DOB: Feb 07, 1941) is a 76 y.o. male who presents to the clinic on 03/27/2017 with a chief complaint of Urticaria and Pruritis .     HPI: James Hammond presents to this clinic in evaluation of hives. Approximately one month ago he developed diffuse red raised itchy lesions across his body that required him to go to his primary care doctor and receive a systemic steroid injection. He had no associated systemic or constitutional symptoms and there was no obvious trigger. His reaction lasted approximately 2 days and all his lesions healed without any scar or hyperpigmentation. However, ever since that event he has been having red areas whenever he scratches himself or has pressure applied to his skin.  There was no obvious provoking factor giving rise to this issue. He woke up at 5 AM in the morning with this reaction and his last meal was at 8:30 PM the previous evening at which point in time he had a handful of mixed nuts which is his common snack. He can't remember any other food consumption of that day. He does not remember having any recent cold or cough although he has had a little bit of a cough since September 2017 ever since he had bypass  surgery.  He may have a little bit of runny nose and some sneezing on occasion and some decreased ability to smell but he can still smell food. This does not appear to be a particularly big issue as long as he takes Zyrtec every day. There is no obvious provoking factor giving rise to this issue.  Past Medical History:  Diagnosis Date  . Arthritis    back   . GERD (gastroesophageal reflux disease)   . Heart murmur    told recently that he has a slight murmur   . Hepatitis    as a child   . Hypertension   . Hypothyroidism    doesn't need treatment at [facility] s point   . Kidney cysts   . Pilonidal cyst   . Rectal abscess 1968  . Urticaria     Past Surgical History:  Procedure Laterality Date  . APPENDECTOMY     76 y.o.  . CARDIAC SURGERY  06/2016   quadruple bypass  . LUMBAR LAMINECTOMY/DECOMPRESSION MICRODISCECTOMY Right 05/13/2015   Procedure: Microdiscectomy - Lumbar three--four  Lumbar four-five - right;  Surgeon: Kary Kos, MD;  Location: [place] [facility];  Service: Neurosurgery;  Laterality: Right;  . PILONIDAL CYST EXCISION     required 30 day hosp. due to  (infection) being in the [employer] [place]   . rectal abscess  1960's    Allergies as of 03/27/2017      Reactions   Protamine Anaphylaxis      Medication  List      aspirin 81 MG tablet Take 81 mg by mouth daily.   cetirizine 10 MG tablet Commonly known as:  ZYRTEC Take 1 tablet daily at bedtime   EPINEPHrine 0.3 mg/0.3 mL Soaj injection Commonly known as:  EPI-PEN AS NEEDED FOR SEVERE ALLERGIC REACTIONS   levothyroxine 25 MCG tablet Commonly known as:  SYNTHROID, LEVOTHROID Take by mouth.   metoprolol succinate 25 MG 24 hr tablet Commonly known as:  TOPROL-XL Take 25 mg by mouth daily.       Review of systems negative except as noted in HPI / PMHx or noted below:  Review of Systems  Constitutional: Negative.   HENT: Negative.   Eyes: Negative.   Respiratory: Negative.     Cardiovascular: Negative.   Gastrointestinal: Negative.   Genitourinary: Negative.   Musculoskeletal: Negative.   Skin: Negative.   Neurological: Negative.   Endo/Heme/Allergies: Negative.   Psychiatric/Behavioral: Negative.     Family History  Problem Relation Age of Onset  . Stroke Father   . Allergic rhinitis Neg Hx   . Angioedema Neg Hx   . Asthma Neg Hx   . Atopy Neg Hx   . Eczema Neg Hx   . Immunodeficiency Neg Hx     Social History   Social History  . Marital status: Married    Spouse name: N/A  . Number of children: N/A  . Years of education: N/A   Occupational History  . Not on file.   Social History Main Topics  . Smoking status: Former Smoker    Quit date: 05/09/1983  . Smokeless tobacco: Never Used  . Alcohol use No  . Drug use: No  . Sexual activity: Not on file   Other Topics Concern  . Not on file   Social History Narrative  . No narrative on file    Environmental and Social history  Lives in a house with a dry environment, no animals located inside the household, hardwood in the bedroom, plastic on the bed, plastic on the pillow, no smokers located inside the household.  Objective:   Vitals:   03/27/17 1355  BP: 140/68  Pulse: 60  Resp: 16  Temp: 98.5 F (36.9 C)   Height: 5\' 6"  (167.6 cm) Weight: 199 lb (90.3 kg)  Physical Exam  Constitutional: He is well-developed, well-nourished, and in no distress.  HENT:  Head: Normocephalic. Head is without right periorbital erythema and without left periorbital erythema.  Right Ear: Tympanic membrane, external ear and ear canal normal.  Left Ear: Tympanic membrane, external ear and ear canal normal.  Nose: Nose normal. No mucosal edema or rhinorrhea.  Mouth/Throat: Uvula is midline, oropharynx is clear and moist and mucous membranes are normal. No oropharyngeal exudate.  Bilateral hearing aid  Eyes: Conjunctivae and lids are normal. Pupils are equal, round, and reactive to light.  Neck:  Trachea normal. No tracheal tenderness present. No tracheal deviation present. No thyromegaly present.  Cardiovascular: Normal rate, regular rhythm, S1 normal and S2 normal.   Murmur (systolic) heard. Pulmonary/Chest: Effort normal and breath sounds normal. No stridor. No tachypnea. No respiratory distress. He has no wheezes. He has no rales. He exhibits no tenderness.  Abdominal: Soft. He exhibits no distension and no mass. There is no hepatosplenomegaly. There is no tenderness. There is no rebound and no guarding.  Musculoskeletal: He exhibits no edema or tenderness.  Lymphadenopathy:       Head (right side): No tonsillar adenopathy present.  Head (left side): No tonsillar adenopathy present.    He has no cervical adenopathy.    He has no axillary adenopathy.  Neurological: He is alert. Gait normal.  Skin: Rash (dermatographia right thigh) noted. He is not diaphoretic. No erythema. No pallor. Nails show no clubbing.  Psychiatric: Mood and affect normal.    Diagnostics: Allergy skin tests were performed. He did not demonstrate any hypersensitivity to a screening panel of aeroallergens or foods.  Assessment and Plan:    1. Allergic urticaria   2. Other allergic rhinitis     1. Allergen avoidance measures?  2. Cetirizine 10 mg tablet 1-2 times a day  3. Blood - CBC w/diff, CMP, TSH, T4, TP, SED, alpha gal, UA  4. Further evaluation and treatment? Yes, if recurrent or uncontrolled  5. Can add in Benadryl if needed  6. 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 He will use a H1 receptor blocker consistently and we will see how things go over the course of the next month or so. He recently was administer systemic steroids and he probably still has a little bit of a steroid effect at  this point in time and hopefully his immunological hyperreactivity has burnt out and he will not redeveloped significant activity as his steroid effect completely abates.  Jiles Prows, MD [facility]

## 2017-03-27 NOTE — Patient Instructions (Addendum)
  1. Allergen avoidance measures?  2. Cetirizine 10 mg tablet 1-2 times a day  3. Blood - CBC w/diff, CMP, TSH, T4, TP, SED, alpha gal, UA  4. Further evaluation and treatment? Yes, if recurrent or uncontrolled  5. Can add in Benadryl if needed  6. Return to clinic in 4 weeks or earlier if problem

## 2017-04-02 LAB — CBC WITH DIFFERENTIAL/PLATELET
BASOS: 0 %
Basophils Absolute: 0 10*3/uL (ref 0.0–0.2)
EOS (ABSOLUTE): 0.2 10*3/uL (ref 0.0–0.4)
EOS: 2 %
Hematocrit: 41.8 % (ref 37.5–51.0)
Hemoglobin: 14.3 g/dL (ref 13.0–17.7)
Immature Grans (Abs): 0 10*3/uL (ref 0.0–0.1)
Immature Granulocytes: 0 %
LYMPHS ABS: 7.3 10*3/uL — AB (ref 0.7–3.1)
Lymphs: 63 %
MCH: 29.7 pg (ref 26.6–33.0)
MCHC: 34.2 g/dL (ref 31.5–35.7)
MCV: 87 fL (ref 79–97)
MONOS ABS: 0.4 10*3/uL (ref 0.1–0.9)
Monocytes: 4 %
NEUTROS ABS: 3.6 10*3/uL (ref 1.4–7.0)
Neutrophils: 31 %
Platelets: 224 10*3/uL (ref 150–379)
RBC: 4.81 x10E6/uL (ref 4.14–5.80)
RDW: 15.5 % — AB (ref 12.3–15.4)
WBC: 11.6 10*3/uL — ABNORMAL HIGH (ref 3.4–10.8)

## 2017-04-02 LAB — URINALYSIS
BILIRUBIN UA: NEGATIVE
Glucose, UA: NEGATIVE
Ketones, UA: NEGATIVE
Leukocytes, UA: NEGATIVE
NITRITE UA: NEGATIVE
PH UA: 7 (ref 5.0–7.5)
Protein, UA: NEGATIVE
RBC, UA: NEGATIVE
Specific Gravity, UA: 1.014 (ref 1.005–1.030)
UUROB: 0.2 mg/dL (ref 0.2–1.0)

## 2017-04-02 LAB — COMPREHENSIVE METABOLIC PANEL
A/G RATIO: 2.2 (ref 1.2–2.2)
ALT: 16 IU/L (ref 0–44)
AST: 19 IU/L (ref 0–40)
Albumin: 4.7 g/dL (ref 3.5–4.8)
Alkaline Phosphatase: 72 IU/L (ref 39–117)
BUN/Creatinine Ratio: 13 (ref 10–24)
BUN: 14 mg/dL (ref 8–27)
Bilirubin Total: 0.5 mg/dL (ref 0.0–1.2)
CALCIUM: 9.9 mg/dL (ref 8.6–10.2)
CO2: 25 mmol/L (ref 20–29)
Chloride: 104 mmol/L (ref 96–106)
Creatinine, Ser: 1.07 mg/dL (ref 0.76–1.27)
GFR, EST AFRICAN AMERICAN: 79 mL/min/{1.73_m2} (ref >59)
GFR, EST NON AFRICAN AMERICAN: 68 mL/min/{1.73_m2} (ref >59)
Globulin, Total: 2.1 g/dL (ref 1.5–4.5)
Glucose: 103 mg/dL — ABNORMAL HIGH (ref 65–99)
POTASSIUM: 5.3 mmol/L — AB (ref 3.5–5.2)
Sodium: 142 mmol/L (ref 134–144)
TOTAL PROTEIN: 6.8 g/dL (ref 6.0–8.5)

## 2017-04-02 LAB — ALPHA-GAL PANEL
Alpha Gal IgE*: <0.1 kU/L (ref <0.35)
BEEF CLASS INTERPRETATION: 0
Class Interpretation: 0
LAMB CLASS INTERPRETATION: 0
Lamb/Mutton (Ovis spp) IgE: <0.1 kU/L (ref <0.35)
Pork (Sus spp) IgE: <0.1 kU/L (ref <0.35)

## 2017-04-02 LAB — THYROID PEROXIDASE ANTIBODY: Thyroperoxidase Ab SerPl-aCnc: 7 IU/mL (ref 0–34)

## 2017-04-02 LAB — SEDIMENTATION RATE: Sed Rate: 3 mm/hr (ref 0–30)

## 2017-04-02 LAB — TSH: TSH: 3.53 u[IU]/mL (ref 0.450–4.500)

## 2017-04-02 LAB — T4, FREE: Free T4: 1.5 ng/dL (ref 0.82–1.77)

## 2017-04-04 ENCOUNTER — Other Ambulatory Visit: Payer: Self-pay | Admitting: Allergy and Immunology

## 2017-04-04 ENCOUNTER — Other Ambulatory Visit: Payer: Self-pay | Admitting: *Deleted

## 2017-04-04 DIAGNOSIS — D72820 Lymphocytosis (symptomatic): Secondary | ICD-10-CM | POA: Diagnosis not present

## 2017-04-10 LAB — COMP PANEL: LEUKEMIA/LYMPHOMA

## 2017-04-10 LAB — PROTEIN ELECTROPHORESIS, SERUM, WITH REFLEX
A/G Ratio: 1.3 (ref 0.7–1.7)
ALPHA 2: 0.9 g/dL (ref 0.4–1.0)
Albumin ELP: 3.9 g/dL (ref 2.9–4.4)
Alpha 1: 0.2 g/dL (ref 0.0–0.4)
BETA: 1.2 g/dL (ref 0.7–1.3)
GLOBULIN, TOTAL: 2.9 g/dL (ref 2.2–3.9)
Gamma Globulin: 0.5 g/dL (ref 0.4–1.8)
Total Protein: 6.8 g/dL (ref 6.0–8.5)

## 2017-04-12 ENCOUNTER — Telehealth: Payer: Self-pay

## 2017-04-12 NOTE — Telephone Encounter (Signed)
Referral faxed to Dr. Bobby Rumpf office.  Thanks

## 2017-04-12 NOTE — Telephone Encounter (Signed)
-----   Message from Rosalio Loud, [place] sent at 04/10/2017  2:03 PM EDT ----- Regarding: Referral Needed Can you please refer Mr. Sturdivant to Dr. Lavera Guise in [place] for investigation of monoclonal B-cell population.  Thank you.

## 2017-04-22 ENCOUNTER — Encounter: Payer: Self-pay | Admitting: *Deleted

## 2017-04-29 ENCOUNTER — Ambulatory Visit: Payer: Medicare Other | Admitting: Allergy and Immunology

## 2017-04-29 DIAGNOSIS — J309 Allergic rhinitis, unspecified: Secondary | ICD-10-CM

## 2017-05-03 DIAGNOSIS — Z803 Family history of malignant neoplasm of breast: Secondary | ICD-10-CM | POA: Diagnosis not present

## 2017-05-03 DIAGNOSIS — D479 Neoplasm of uncertain behavior of lymphoid, hematopoietic and related tissue, unspecified: Secondary | ICD-10-CM | POA: Diagnosis not present

## 2017-05-03 DIAGNOSIS — D47Z9 Other specified neoplasms of uncertain behavior of lymphoid, hematopoietic and related tissue: Secondary | ICD-10-CM | POA: Diagnosis not present

## 2017-05-03 DIAGNOSIS — Z87891 Personal history of nicotine dependence: Secondary | ICD-10-CM | POA: Diagnosis not present

## 2017-05-03 DIAGNOSIS — Z800 Family history of malignant neoplasm of digestive organs: Secondary | ICD-10-CM | POA: Diagnosis not present

## 2017-05-03 DIAGNOSIS — Z808 Family history of malignant neoplasm of other organs or systems: Secondary | ICD-10-CM | POA: Diagnosis not present

## 2017-05-03 DIAGNOSIS — Z8042 Family history of malignant neoplasm of prostate: Secondary | ICD-10-CM | POA: Diagnosis not present

## 2017-05-13 ENCOUNTER — Telehealth: Payer: Self-pay | Admitting: Allergy and Immunology

## 2017-05-13 NOTE — Telephone Encounter (Signed)
Patient received a no show bill for a missed appt on 7-30. He said that Dr. Neldon Mc referred him out to Dr. Bobby Rumpf and Mr. Stroschein assumed that meant he didn't need to keep his return appt with Dr. Neldon Mc. He said he has never no showed an appt. He also said he never received the automated reminder call. I confirmed his phone #, so I am not sure why he didn't receive his reminder call. He would like someone to call him back. Thank you.

## 2017-05-13 NOTE — Telephone Encounter (Signed)
Called twice no answer - left message that I have adjusted no show fee - kt

## 2017-06-01 DIAGNOSIS — S61213A Laceration without foreign body of left middle finger without damage to nail, initial encounter: Secondary | ICD-10-CM | POA: Diagnosis not present

## 2017-06-06 DIAGNOSIS — R739 Hyperglycemia, unspecified: Secondary | ICD-10-CM | POA: Diagnosis not present

## 2017-06-06 DIAGNOSIS — E785 Hyperlipidemia, unspecified: Secondary | ICD-10-CM | POA: Diagnosis not present

## 2017-06-06 DIAGNOSIS — K219 Gastro-esophageal reflux disease without esophagitis: Secondary | ICD-10-CM | POA: Diagnosis not present

## 2017-06-06 DIAGNOSIS — I2510 Atherosclerotic heart disease of native coronary artery without angina pectoris: Secondary | ICD-10-CM | POA: Diagnosis not present

## 2017-06-06 DIAGNOSIS — Z23 Encounter for immunization: Secondary | ICD-10-CM | POA: Diagnosis not present

## 2017-06-06 DIAGNOSIS — C9190 Lymphoid leukemia, unspecified not having achieved remission: Secondary | ICD-10-CM | POA: Diagnosis not present

## 2017-06-06 DIAGNOSIS — E039 Hypothyroidism, unspecified: Secondary | ICD-10-CM | POA: Diagnosis not present

## 2017-06-12 ENCOUNTER — Other Ambulatory Visit: Payer: Self-pay

## 2017-06-12 ENCOUNTER — Ambulatory Visit (INDEPENDENT_AMBULATORY_CARE_PROVIDER_SITE_OTHER): Payer: Medicare Other | Admitting: Cardiology

## 2017-06-12 ENCOUNTER — Encounter: Payer: Self-pay | Admitting: Cardiology

## 2017-06-12 VITALS — BP 140/70 | HR 64 | Resp 12 | Ht 67.0 in | Wt 201.8 lb

## 2017-06-12 DIAGNOSIS — C9110 Chronic lymphocytic leukemia of B-cell type not having achieved remission: Secondary | ICD-10-CM | POA: Insufficient documentation

## 2017-06-12 DIAGNOSIS — I10 Essential (primary) hypertension: Secondary | ICD-10-CM | POA: Diagnosis not present

## 2017-06-12 DIAGNOSIS — E782 Mixed hyperlipidemia: Secondary | ICD-10-CM

## 2017-06-12 DIAGNOSIS — C9190 Lymphoid leukemia, unspecified not having achieved remission: Secondary | ICD-10-CM

## 2017-06-12 DIAGNOSIS — Z951 Presence of aortocoronary bypass graft: Secondary | ICD-10-CM

## 2017-06-12 DIAGNOSIS — D47Z9 Other specified neoplasms of uncertain behavior of lymphoid, hematopoietic and related tissue: Secondary | ICD-10-CM | POA: Insufficient documentation

## 2017-06-12 HISTORY — DX: Presence of aortocoronary bypass graft: Z95.1

## 2017-06-12 MED ORDER — EZETIMIBE 10 MG PO TABS: 10.0000 mg | ORAL_TABLET | Freq: Every day | ORAL | 11 refills | Status: DC

## 2017-06-12 NOTE — Progress Notes (Signed)
Cardiology Office Note:    Date:  06/12/2017   ID:  James Hammond, DOB October 12, 1940, MRN [id number]  PCP:  Street, [name], MD  Cardiologist:  Jenne Campus, MD    Referring MD: Street, [name], *   Chief Complaint  Patient presents with  . Follow-up  "I am doing well"  History of Present Illness:    James Hammond is a 76 y.o. male  with history of coronary artery disease. Recently he was diagnosed with chronic lymphocytic leukemia. Initial manifestation of the problem was some skin changes, however all gone. He is following up with hematology and seems to be doing well cardiac wise denies having the problem is very active he likes gardening he likes canning vegetables is very busy doing this and have no difficulty. Denies having any chest pain tightness squeezing pressure burning chest. Does not exercise on the radial basis previously he had difficulty with statin and he did not want to take any statin which started talking about potentially injectable medication but he was reluctant today he is willing to try zetia  Past Medical History:  Diagnosis Date  . Arthritis    back   . GERD (gastroesophageal reflux disease)   . Heart murmur    told recently that he has a slight murmur   . Hepatitis    as a child   . Hyperlipidemia   . Hypertension   . Hypothyroidism    doesn't need treatment at [facility] s point   . Kidney cysts   . Pilonidal cyst   . Rectal abscess 1968  . Urticaria     Past Surgical History:  Procedure Laterality Date  . APPENDECTOMY     76 y.o.  . CARDIAC CATHETERIZATION    . CARDIAC SURGERY  06/2016   quadruple bypass  . CORONARY ARTERY BYPASS GRAFT    . LUMBAR LAMINECTOMY/DECOMPRESSION MICRODISCECTOMY Right 05/13/2015   Procedure: Microdiscectomy - Lumbar three--four  Lumbar four-five - right;  Surgeon: Kary Kos, MD;  Location: [place] [facility];  Service: Neurosurgery;  Laterality: Right;  . PILONIDAL CYST EXCISION     required 30 day hosp. due  to  (infection) being in the [employer] & [place] the [place]   . rectal abscess  1960's    Current Medications: Current Meds  Medication Sig  . aspirin 81 MG tablet Take 81 mg by mouth daily.  . cetirizine (ZYRTEC) 10 MG tablet Take 1 tablet daily at bedtime  . EPINEPHrine 0.3 mg/0.3 mL IJ SOAJ injection AS NEEDED FOR SEVERE ALLERGIC REACTIONS  . levothyroxine (SYNTHROID, LEVOTHROID) 25 MCG tablet Take 25 mcg by mouth. 1 Daily except 2 on Thursday and Sunday  . metoprolol succinate (TOPROL-XL) 25 MG 24 hr tablet Take 25 mg by mouth daily.     Allergies:   Protamine   Social History   Social History  . Marital status: Married    Spouse name: N/A  . Number of children: N/A  . Years of education: N/A   Social History Main Topics  . Smoking status: Former Smoker    Quit date: 05/09/1983  . Smokeless tobacco: Never Used  . Alcohol use No  . Drug use: No  . Sexual activity: Not Asked   Other Topics Concern  . None   Social History Narrative  . None     Family History: The patient's family history includes Cancer in his sister; Heart attack in his mother; Prostate cancer in his brother; Stroke in his brother  and father. There is no history of Allergic rhinitis, Angioedema, Asthma, Atopy, Eczema, or Immunodeficiency. ROS:   Please see the history of present illness.    All 14 point review of systems negative except as described per history of present illness  EKGs/Labs/Other Studies Reviewed:      Recent Labs: 03/27/2017: ALT 16; BUN 14; Creatinine, Ser 1.07; Hemoglobin 14.3; Platelets 224; Potassium 5.3; Sodium 142; TSH 3.530  Recent Lipid Panel No results found for: CHOL, TRIG, HDL, CHOLHDL, VLDL, LDLCALC, LDLDIRECT  Physical Exam:    VS:  BP 140/70   Pulse 64   Resp 12   Ht 5\' 7"  (1.702 m)   Wt 201 lb 12.8 oz (91.5 kg)   BMI 31.61 kg/m     Wt Readings from Last 3 Encounters:  06/12/17 201 lb 12.8 oz (91.5 kg)  03/27/17 199 lb (90.3 kg)  05/13/15 204 lb  (92.5 kg)     GEN:  Well nourished, well developed in no acute distress HEENT: Normal NECK: No JVD; No carotid bruits LYMPHATICS: No lymphadenopathy CARDIAC: RRR, no murmurs, no rubs, no gallops RESPIRATORY:  Clear to auscultation without rales, wheezing or rhonchi  ABDOMEN: Soft, non-tender, non-distended MUSCULOSKELETAL:  No edema; No deformity  SKIN: Warm and dry LOWER EXTREMITIES: no swelling NEUROLOGIC:  Alert and oriented x 3 PSYCHIATRIC:  Normal affect   ASSESSMENT:    1. Benign hypertension   2. Mixed hyperlipidemia   3. Status post coronary artery bypass graft   4. CLL (chronic lymphocytic leukemia) (HCC)    PLAN:    In order of problems listed above:  1. Benign essential hypertension: Blood pressure stable continue present management. 2. Hyperlipidemia: We'll try said he however in the future we'll talk potentially injectable medications. 3. Status post coronary artery bypass grafting well from that point review. 4. CLL follow-up by hematology.   Medication Adjustments/Labs and Tests Ordered: Current medicines are reviewed at length with the patient today.  Concerns regarding medicines are outlined above.  No orders of the defined types were placed in this encounter.  Medication changes: No orders of the defined types were placed in this encounter.   Signed, Park Liter, MD, [facility] 06/12/2017 11:34 AM    [place]

## 2017-06-12 NOTE — Patient Instructions (Signed)
Medication Instructions:  Your physician has recommended you make the following change in your medication:  1.) START Zetia 10 mg daily.   Labwork: Your physician recommends that you return for a FASTING lipid profile: 6 weeks at your primary care doctors office. Please take the order that is attached to this summary.   Testing/Procedures: None  Follow-Up: Your physician recommends that you schedule a follow-up appointment in: 5 months   Any Other Special Instructions Will Be Listed Below (If Applicable).  Please note that any paperwork needing to be filled out by the provider will need to be addressed at the front desk prior to seeing the provider. Please note that any paperwork FMLA, Disability or other documents regarding health condition is subject to a $25.00 charge that must be received prior to completion of paperwork in the form of a money order or check.     If you need a refill on your cardiac medications before your next appointment, please call your pharmacy.

## 2017-06-12 NOTE — Addendum Note (Signed)
Addended by: Kathyrn Sheriff on: 06/12/2017 11:40 AM   Modules accepted: Orders

## 2017-06-22 DIAGNOSIS — L570 Actinic keratosis: Secondary | ICD-10-CM | POA: Diagnosis not present

## 2017-06-22 DIAGNOSIS — L821 Other seborrheic keratosis: Secondary | ICD-10-CM | POA: Diagnosis not present

## 2017-06-24 ENCOUNTER — Telehealth: Payer: Self-pay

## 2017-06-24 NOTE — Telephone Encounter (Signed)
S.w pt he calls stating that since starting zetia 1 week ago he feels awful and feels "9 months pregnant." He states he stopped the medication 3 days ago. I have advised to continue to hold this medication and call back in 1 week to let me know how he is doing!

## 2017-10-02 DIAGNOSIS — R140 Abdominal distension (gaseous): Secondary | ICD-10-CM | POA: Diagnosis not present

## 2017-11-08 DIAGNOSIS — D47Z9 Other specified neoplasms of uncertain behavior of lymphoid, hematopoietic and related tissue: Secondary | ICD-10-CM | POA: Diagnosis not present

## 2017-11-08 DIAGNOSIS — D479 Neoplasm of uncertain behavior of lymphoid, hematopoietic and related tissue, unspecified: Secondary | ICD-10-CM | POA: Diagnosis not present

## 2017-11-19 ENCOUNTER — Other Ambulatory Visit: Payer: Self-pay | Admitting: Gastroenterology

## 2017-11-19 DIAGNOSIS — R140 Abdominal distension (gaseous): Secondary | ICD-10-CM

## 2017-11-26 ENCOUNTER — Ambulatory Visit
Admission: RE | Admit: 2017-11-26 | Discharge: 2017-11-26 | Disposition: A | Discharge status: Home / Self Care | Payer: Medicare Other | Source: Ambulatory Visit | Attending: Gastroenterology | Admitting: Gastroenterology

## 2017-11-26 DIAGNOSIS — R140 Abdominal distension (gaseous): Secondary | ICD-10-CM

## 2017-11-26 DIAGNOSIS — K8020 Calculus of gallbladder without cholecystitis without obstruction: Secondary | ICD-10-CM | POA: Diagnosis not present

## 2017-11-29 ENCOUNTER — Ambulatory Visit
Admission: RE | Admit: 2017-11-29 | Discharge: 2017-11-29 | Disposition: A | Discharge status: Home / Self Care | Payer: Medicare Other | Source: Ambulatory Visit | Attending: Gastroenterology | Admitting: Gastroenterology

## 2017-11-29 DIAGNOSIS — R140 Abdominal distension (gaseous): Secondary | ICD-10-CM | POA: Diagnosis not present

## 2017-12-20 ENCOUNTER — Encounter: Payer: Self-pay | Admitting: Cardiology

## 2017-12-20 ENCOUNTER — Encounter: Payer: Self-pay | Admitting: *Deleted

## 2017-12-20 ENCOUNTER — Ambulatory Visit: Payer: Medicare Other | Admitting: Cardiology

## 2017-12-20 VITALS — BP 150/76 | HR 71 | Ht 67.0 in | Wt 199.4 lb

## 2017-12-20 DIAGNOSIS — I350 Nonrheumatic aortic (valve) stenosis: Secondary | ICD-10-CM | POA: Diagnosis not present

## 2017-12-20 DIAGNOSIS — C9110 Chronic lymphocytic leukemia of B-cell type not having achieved remission: Secondary | ICD-10-CM

## 2017-12-20 DIAGNOSIS — I10 Essential (primary) hypertension: Secondary | ICD-10-CM

## 2017-12-20 DIAGNOSIS — I25110 Atherosclerotic heart disease of native coronary artery with unstable angina pectoris: Secondary | ICD-10-CM | POA: Diagnosis not present

## 2017-12-20 DIAGNOSIS — C9190 Lymphoid leukemia, unspecified not having achieved remission: Secondary | ICD-10-CM | POA: Diagnosis not present

## 2017-12-20 DIAGNOSIS — E782 Mixed hyperlipidemia: Secondary | ICD-10-CM | POA: Diagnosis not present

## 2017-12-20 HISTORY — DX: Nonrheumatic aortic (valve) stenosis: I35.0

## 2017-12-20 MED ORDER — PRAVASTATIN SODIUM 20 MG PO TABS: 20.0000 mg | ORAL_TABLET | Freq: Every evening | ORAL | 3 refills | Status: DC

## 2017-12-20 NOTE — Patient Instructions (Signed)
Medication Instructions:  Your physician has recommended you make the following change in your medication:  START pravastatin 20 mg daily   Labwork: None  Testing/Procedures: Your physician has requested that you have en exercise stress myoview. For further information please visit [email or web address]. Please follow instruction sheet, as given.  Your physician has requested that you have an echocardiogram. Echocardiography is a painless test that uses sound waves to create images of your heart. It provides your doctor with information about the size and shape of your heart and how well your heart's chambers and valves are working. This procedure takes approximately one hour. There are no restrictions for this procedure.   Follow-Up: Your physician wants you to follow-up in: 4 months. You will receive a reminder letter in the mail two months in advance. If you don't receive a letter, please call our office to schedule the follow-up appointment.   Any Other Special Instructions Will Be Listed Below (If Applicable).     If you need a refill on your cardiac medications before your next appointment, please call your pharmacy.

## 2017-12-20 NOTE — Progress Notes (Signed)
Cardiology Office Note:    Date:  12/20/2017   ID:  James Hammond, DOB Oct 23, 1940, MRN [id number]  PCP:  Street, [name], MD  Cardiologist:  Jenne Campus, MD    Referring MD: Street, [name], *   Chief Complaint  Patient presents with  . Follow-up  Having some gas problem  History of Present Illness:    James Hammond is a 77 y.o. male with coronary artery disease, status post coronary artery bypass graft in 2017.  Also mild aortic stenosis based on echocardiogram from March 2018.  He is coming for regular follow-up.  The biggest complaint appears to be gas.  He is scheduled to see gastroenterologist to decide if gallbladder stone that he got responsible for the symptoms and some disposition will be made regarding that condition.  Denies having any chest pain tightness squeezing pressure burning chest but before his bypass surgery he also had some gas.  I will had a long discussion about what to do with the situation and we decided to proceed with stress testing.  He will be scheduled to have exercise Cardiolite.  Also does have aortic stenosis.  A year ago echocardiogram showed only mild gradient.  I will ask him to have repeated echocardiogram to recheck on the aortic stenosis.  We also have difficulty with a cholesterol.  He try Crestor or Lipitor with some difficulties.  Also Zetia he stopped that because of gas.  He is willing to try pravastatin 20 mg which I will do.  Because of his CLL I do not think he will be a candidate for PSK 9 agent.  Past Medical History:  Diagnosis Date  . Arthritis    back   . Cancer (HCC)    Chronic Lymphatic Luekemia  . Gallstone   . GERD (gastroesophageal reflux disease)   . Heart murmur    told recently that he has a slight murmur   . Hepatitis    as a child   . Hiatal hernia   . Hyperlipidemia   . Hypertension   . Hypothyroidism    doesn't need treatment at [facility] s point   . Kidney cysts   . Kidney cysts   . Kidney stone     . Pilonidal cyst   . Rectal abscess 1968  . Urticaria     Past Surgical History:  Procedure Laterality Date  . APPENDECTOMY     77 y.o.  . CARDIAC CATHETERIZATION    . CARDIAC SURGERY  06/2016   quadruple bypass  . CORONARY ARTERY BYPASS GRAFT    . LUMBAR LAMINECTOMY/DECOMPRESSION MICRODISCECTOMY Right 05/13/2015   Procedure: Microdiscectomy - Lumbar three--four  Lumbar four-five - right;  Surgeon: Kary Kos, MD;  Location: [place] [facility];  Service: Neurosurgery;  Laterality: Right;  . PILONIDAL CYST EXCISION     required 30 day hosp. due to  (infection) being in the [employer] & [place]   . rectal abscess  1960's    Current Medications: Current Meds  Medication Sig  . aspirin 81 MG tablet Take 81 mg by mouth daily.  . cetirizine (ZYRTEC) 10 MG tablet Take 1 tablet daily at bedtime  . EPINEPHrine 0.3 mg/0.3 mL IJ SOAJ injection AS NEEDED FOR SEVERE ALLERGIC REACTIONS  . famotidine (PEPCID) 10 MG tablet Take 10 mg by mouth 2 (two) times daily.  Marland Kitchen levothyroxine (SYNTHROID, LEVOTHROID) 25 MCG tablet Take 25 mcg by mouth. 1 Daily except 2 on Thursday and Sunday  . metoprolol  succinate (TOPROL-XL) 25 MG 24 hr tablet Take 25 mg by mouth daily.     Allergies:   Protamine   Social History   Socioeconomic History  . Marital status: Married    Spouse name: Not on file  . Number of children: Not on file  . Years of education: Not on file  . Highest education level: Not on file  Occupational History  . Not on file  Social Needs  . Financial resource strain: Not on file  . Food insecurity:    Worry: Not on file    Inability: Not on file  . Transportation needs:    Medical: Not on file    Non-medical: Not on file  Tobacco Use  . Smoking status: Former Smoker    Last attempt to quit: 05/09/1983    Years since quitting: 34.6  . Smokeless tobacco: Never Used  Substance and Sexual Activity  . Alcohol use: No  . Drug use: No  . Sexual activity: Not on file   Lifestyle  . Physical activity:    Days per week: Not on file    Minutes per session: Not on file  . Stress: Not on file  Relationships  . Social connections:    Talks on phone: Not on file    Gets together: Not on file    Attends religious service: Not on file    Active member of club or organization: Not on file    Attends meetings of clubs or organizations: Not on file    Relationship status: Not on file  Other Topics Concern  . Not on file  Social History Narrative  . Not on file     Family History: The patient's family history includes Cancer in his sister; Heart attack in his mother; Prostate cancer in his brother; Stroke in his brother and father. There is no history of Allergic rhinitis, Angioedema, Asthma, Atopy, Eczema, or Immunodeficiency. ROS:   Please see the history of present illness.    All 14 point review of systems negative except as described per history of present illness  EKGs/Labs/Other Studies Reviewed:      Recent Labs: 03/27/2017: ALT 16; BUN 14; Creatinine, Ser 1.07; Hemoglobin 14.3; Platelets 224; Potassium 5.3; Sodium 142; TSH 3.530  Recent Lipid Panel No results found for: CHOL, TRIG, HDL, CHOLHDL, VLDL, LDLCALC, LDLDIRECT  Physical Exam:    VS:  BP (!) 150/76   Pulse 71   Ht 5\' 7"  (1.702 m)   Wt 199 lb 6.4 oz (90.4 kg)   SpO2 96%   BMI 31.23 kg/m     Wt Readings from Last 3 Encounters:  12/20/17 199 lb 6.4 oz (90.4 kg)  06/12/17 201 lb 12.8 oz (91.5 kg)  03/27/17 199 lb (90.3 kg)     GEN:  Well nourished, well developed in no acute distress HEENT: Normal NECK: No JVD; No carotid bruits LYMPHATICS: No lymphadenopathy CARDIAC: RRR, no murmurs, no rubs, no gallops RESPIRATORY:  Clear to auscultation without rales, wheezing or rhonchi  ABDOMEN: Soft, non-tender, non-distended MUSCULOSKELETAL:  No edema; No deformity  SKIN: Warm and dry LOWER EXTREMITIES: no swelling NEUROLOGIC:  Alert and oriented x 3 PSYCHIATRIC:  Normal affect    ASSESSMENT:    1. Benign hypertension   2. Nonrheumatic aortic valve stenosis   3. Coronary artery disease involving native coronary artery of native heart with unstable angina pectoris ([place])   4. CLL (chronic lymphocytic leukemia) ([place])   5. Mixed hyperlipidemia    PLAN:  In order of problems listed above:  1. Benign essential hypertension: Blood pressure appears to be slightly elevated today.  We will continue monitoring.  He tells me that every single time he comes to Dr. his blood pressure is elevated is normal at home. 2. Aortic stenosis: Echocardiogram will be done to check his aortic stenosis. 3. Coronary artery disease: He will be scheduled to have a stress test. 4. CLL: Followed by internal medicine team. 5. His lipidemia will try pravastatin 20.  See him back in my office in about 4 months.   Medication Adjustments/Labs and Tests Ordered: Current medicines are reviewed at length with the patient today.  Concerns regarding medicines are outlined above.  No orders of the defined types were placed in this encounter.  Medication changes: No orders of the defined types were placed in this encounter.   Signed, Park Liter, MD, [facility] 12/20/2017 8:43 AM    [place]

## 2017-12-20 NOTE — Addendum Note (Signed)
Addended by: Austin Miles on: 12/20/2017 08:57 AM   Modules accepted: Orders

## 2017-12-23 DIAGNOSIS — K449 Diaphragmatic hernia without obstruction or gangrene: Secondary | ICD-10-CM | POA: Diagnosis not present

## 2017-12-23 DIAGNOSIS — K8020 Calculus of gallbladder without cholecystitis without obstruction: Secondary | ICD-10-CM | POA: Diagnosis not present

## 2017-12-26 ENCOUNTER — Telehealth (HOSPITAL_COMMUNITY): Payer: Self-pay | Admitting: *Deleted

## 2017-12-26 NOTE — Telephone Encounter (Signed)
Left message on voicemail per DPR in reference to upcoming appointment scheduled on 12/31/17 with detailed instructions given per Myocardial Perfusion Study Information Sheet for the test. LM to arrive 15 minutes early, and that it is imperative to arrive on time for appointment to keep from having the test rescheduled. If you need to cancel or reschedule your appointment, please call the office within 24 hours of your appointment. Failure to do so may result in a cancellation of your appointment, and a $50 no show fee. Phone number given for call back for any questions. Kirstie Peri

## 2017-12-31 ENCOUNTER — Encounter: Payer: Self-pay | Admitting: Cardiology

## 2017-12-31 ENCOUNTER — Other Ambulatory Visit: Payer: Self-pay

## 2017-12-31 ENCOUNTER — Ambulatory Visit (INDEPENDENT_AMBULATORY_CARE_PROVIDER_SITE_OTHER): Payer: Medicare Other | Admitting: Cardiology

## 2017-12-31 ENCOUNTER — Ambulatory Visit (HOSPITAL_BASED_OUTPATIENT_CLINIC_OR_DEPARTMENT_OTHER): Payer: Medicare Other

## 2017-12-31 ENCOUNTER — Ambulatory Visit (HOSPITAL_COMMUNITY): Payer: Medicare Other | Attending: Cardiology

## 2017-12-31 ENCOUNTER — Encounter: Payer: Self-pay | Admitting: *Deleted

## 2017-12-31 VITALS — BP 165/73 | HR 59 | Ht 67.0 in | Wt 199.0 lb

## 2017-12-31 VITALS — Ht 67.0 in | Wt 199.0 lb

## 2017-12-31 DIAGNOSIS — R9439 Abnormal result of other cardiovascular function study: Secondary | ICD-10-CM

## 2017-12-31 DIAGNOSIS — C9190 Lymphoid leukemia, unspecified not having achieved remission: Secondary | ICD-10-CM | POA: Insufficient documentation

## 2017-12-31 DIAGNOSIS — I10 Essential (primary) hypertension: Secondary | ICD-10-CM

## 2017-12-31 DIAGNOSIS — C9110 Chronic lymphocytic leukemia of B-cell type not having achieved remission: Secondary | ICD-10-CM

## 2017-12-31 DIAGNOSIS — E785 Hyperlipidemia, unspecified: Secondary | ICD-10-CM | POA: Diagnosis not present

## 2017-12-31 DIAGNOSIS — I25110 Atherosclerotic heart disease of native coronary artery with unstable angina pectoris: Secondary | ICD-10-CM

## 2017-12-31 DIAGNOSIS — Z01818 Encounter for other preprocedural examination: Secondary | ICD-10-CM | POA: Diagnosis not present

## 2017-12-31 DIAGNOSIS — E782 Mixed hyperlipidemia: Secondary | ICD-10-CM

## 2017-12-31 DIAGNOSIS — I350 Nonrheumatic aortic (valve) stenosis: Secondary | ICD-10-CM

## 2017-12-31 LAB — MYOCARDIAL PERFUSION IMAGING
CHL CUP NUCLEAR SRS: 12
CHL CUP NUCLEAR SSS: 22
CHL RATE OF PERCEIVED EXERTION: 19
CSEPEDS: 30 s
CSEPEW: 6.4 METS
Exercise duration (min): 4 min
LV dias vol: 124 mL (ref 62–150)
LV sys vol: 71 mL
MPHR: 145 {beats}/min
Peak HR: 127 {beats}/min
Percent HR: 87 %
RATE: 0.35
Rest HR: 59 {beats}/min
SDS: 10
TID: 0.98

## 2017-12-31 LAB — CBC
HEMATOCRIT: 41.2 % (ref 37.5–51.0)
Hemoglobin: 14.1 g/dL (ref 13.0–17.7)
MCH: 29.4 pg (ref 26.6–33.0)
MCHC: 34.2 g/dL (ref 31.5–35.7)
MCV: 86 fL (ref 79–97)
PLATELETS: 199 10*3/uL (ref 150–379)
RBC: 4.8 x10E6/uL (ref 4.14–5.80)
RDW: 14.5 % (ref 12.3–15.4)
WBC: 10.9 10*3/uL — ABNORMAL HIGH (ref 3.4–10.8)

## 2017-12-31 LAB — PROTIME-INR
INR: 0.9 (ref 0.8–1.2)
Prothrombin Time: 10.2 s (ref 9.1–12.0)

## 2017-12-31 LAB — BASIC METABOLIC PANEL
BUN / CREAT RATIO: 14 (ref 10–24)
BUN: 17 mg/dL (ref 8–27)
CALCIUM: 10.2 mg/dL (ref 8.6–10.2)
CHLORIDE: 102 mmol/L (ref 96–106)
CO2: 26 mmol/L (ref 20–29)
Creatinine, Ser: 1.25 mg/dL (ref 0.76–1.27)
GFR calc Af Amer: 65 mL/min/{1.73_m2} (ref >59)
GFR, EST NON AFRICAN AMERICAN: 56 mL/min/{1.73_m2} — AB (ref >59)
Glucose: 112 mg/dL — ABNORMAL HIGH (ref 65–99)
POTASSIUM: 4.9 mmol/L (ref 3.5–5.2)
Sodium: 138 mmol/L (ref 134–144)

## 2017-12-31 LAB — ECHOCARDIOGRAM COMPLETE
Height: 67 in
Weight: 3184 oz

## 2017-12-31 IMAGING — NM NM MISC PROCEDURE
6 series · 36 of 36 positions shown · non-contrast
Comparison: none

[Series 1: stress-sum-em · 6.40mm/px · 6 of 64 frames shown]
[frame 6/64]
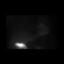
[frame 16/64]
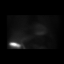
[frame 27/64]
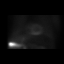
[frame 38/64]
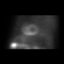
[frame 48/64]
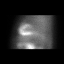
[frame 59/64]
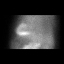

[Series 1: rest · 6.40mm/px · 6 of 64 frames shown]
[frame 6/64]
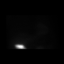
[frame 16/64]
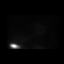
[frame 27/64]
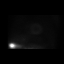
[frame 38/64]
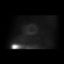
[frame 48/64]
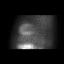
[frame 59/64]
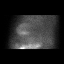

[Series 1: stress-gsp · 6.40mm/px · 6 of 512 frames shown]
[frame 43/512]
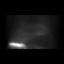
[frame 128/512]
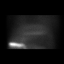
[frame 214/512]
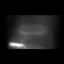
[frame 299/512]
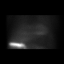
[frame 384/512]
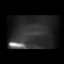
[frame 470/512]
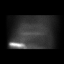

[Series 1: wbr_s-proj_st stress-gsp · 6.40mm/px · 6 of 512 frames shown]
[frame 43/512]
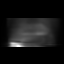
[frame 128/512]
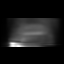
[frame 214/512]
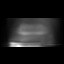
[frame 299/512]
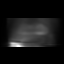
[frame 384/512]
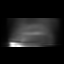
[frame 470/512]
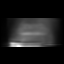

[Series 1: wbr_r-proj_st rest · 6.40mm/px · 6 of 64 frames shown]
[frame 6/64]
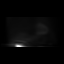
[frame 16/64]
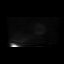
[frame 27/64]
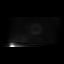
[frame 38/64]
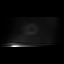
[frame 48/64]
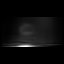
[frame 59/64]
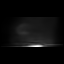

[Series 1: wbr_s-proj_st stress-sum-em · 6.40mm/px · 6 of 64 frames shown]
[frame 6/64]
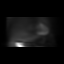
[frame 16/64]
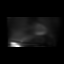
[frame 27/64]
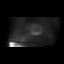
[frame 38/64]
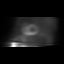
[frame 48/64]
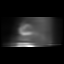
[frame 59/64]
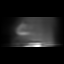

[36 of 36 positions shown; findings below may reference images not displayed]

Canned report from images found in remote index.

Refer to host system for actual result text.

## 2017-12-31 MED ORDER — TECHNETIUM TC 99M TETROFOSMIN IV KIT
33.0000 | PACK | Freq: Once | INTRAVENOUS | Status: AC | PRN
  Administered 2017-12-31: 33 via INTRAVENOUS
  Filled 2017-12-31: qty 33

## 2017-12-31 MED ORDER — TECHNETIUM TC 99M TETROFOSMIN IV KIT
10.1000 | PACK | Freq: Once | INTRAVENOUS | Status: AC | PRN
  Administered 2017-12-31: 10.1 via INTRAVENOUS
  Filled 2017-12-31: qty 11

## 2017-12-31 NOTE — H&P (View-Only) (Signed)
Cardiology Office Note:    Date:  12/31/2017   ID:  James Hammond, DOB May 07, 1941, MRN [id number]  PCP:  Street, [name], MD  Cardiologist:  No primary care provider on file. Jenne Campus MD  Referring MD: Street, [name], *     History of Present Illness:    James Hammond is a 77 y.o. male here in our office today as I am DOD for nuclear stress test.  His nuclear stress test was markedly abnormal, high risk with anterolateral ischemia large territory.  EKG markedly abnormal as well with ST segment depressions consistent with ischemia.  He is post bypass surgery in 2017.  He had discomfort on the treadmill.  He is now chest pain-free.  He thought that his discomfort was secondary to his hiatal hernia and gallbladder disease.  He also has an aortic stenosis murmur and prior to his bypass had a mild gradient.  It was not replaced.  Echocardiogram was also performed today, results pending.  Pravastatin was started at last visit.  Denies any fevers chills nausea vomiting syncope.  Past Medical History:  Diagnosis Date  . Arthritis    back   . Cancer (HCC)    Chronic Lymphatic Luekemia  . Gallstone   . GERD (gastroesophageal reflux disease)   . Heart murmur    told recently that he has a slight murmur   . Hepatitis    as a child   . Hiatal hernia   . Hyperlipidemia   . Hypertension   . Hypothyroidism    doesn't need treatment at [facility] s point   . Kidney cysts   . Kidney cysts   . Kidney stone   . Pilonidal cyst   . Rectal abscess 1968  . Urticaria     Past Surgical History:  Procedure Laterality Date  . APPENDECTOMY     77 y.o.  . CARDIAC CATHETERIZATION    . CARDIAC SURGERY  06/2016   quadruple bypass  . CORONARY ARTERY BYPASS GRAFT    . LUMBAR LAMINECTOMY/DECOMPRESSION MICRODISCECTOMY Right 05/13/2015   Procedure: Microdiscectomy - Lumbar three--four  Lumbar four-five - right;  Surgeon: Kary Kos, MD;  Location: [place] [facility];  Service:  Neurosurgery;  Laterality: Right;  . PILONIDAL CYST EXCISION     required 30 day hosp. due to  (infection) being in the [employer] & [place]   . rectal abscess  1960's    Current Medications: No outpatient medications have been marked as taking for the 12/31/17 encounter (Office Visit) with Jerline Pain, MD.     Allergies:   Protamine   Social History   Socioeconomic History  . Marital status: Married    Spouse name: Not on file  . Number of children: Not on file  . Years of education: Not on file  . Highest education level: Not on file  Occupational History  . Not on file  Social Needs  . Financial resource strain: Not on file  . Food insecurity:    Worry: Not on file    Inability: Not on file  . Transportation needs:    Medical: Not on file    Non-medical: Not on file  Tobacco Use  . Smoking status: Former Smoker    Last attempt to quit: 05/09/1983    Years since quitting: 34.6  . Smokeless tobacco: Never Used  Substance and Sexual Activity  . Alcohol use: No  . Drug use: No  . Sexual activity: Not on  file  Lifestyle  . Physical activity:    Days per week: Not on file    Minutes per session: Not on file  . Stress: Not on file  Relationships  . Social connections:    Talks on phone: Not on file    Gets together: Not on file    Attends religious service: Not on file    Active member of club or organization: Not on file    Attends meetings of clubs or organizations: Not on file    Relationship status: Not on file  Other Topics Concern  . Not on file  Social History Narrative  . Not on file     Family History: The patient's family history includes Cancer in his sister; Heart attack in his mother; Prostate cancer in his brother; Stroke in his brother and father. There is no history of Allergic rhinitis, Angioedema, Asthma, Atopy, Eczema, or Immunodeficiency.  ROS:   Please see the history of present illness.     All other systems reviewed and  are negative.  EKGs/Labs/Other Studies Reviewed:    The following studies were reviewed today: Nuclear stress test, EKGs  EKG: With nuclear stress test, market ST segment depression.  He did have a 4-second episode of what looks like atrial flutter transiently that broke.  Recent Labs: 03/27/2017: ALT 16; BUN 14; Creatinine, Ser 1.07; Hemoglobin 14.3; Platelets 224; Potassium 5.3; Sodium 142; TSH 3.530  Recent Lipid Panel No results found for: CHOL, TRIG, HDL, CHOLHDL, VLDL, LDLCALC, LDLDIRECT  Physical Exam:    VS:  BP (!) 165/73   Pulse (!) 59   Ht 5\' 7"  (1.702 m)   Wt 199 lb (90.3 kg)   BMI 31.17 kg/m     Wt Readings from Last 3 Encounters:  12/31/17 199 lb (90.3 kg)  12/31/17 199 lb (90.3 kg)  12/20/17 199 lb 6.4 oz (90.4 kg)     GEN:  Well nourished, well developed in no acute distress, overweight HEENT: Normal NECK: No JVD; No carotid bruits LYMPHATICS: No lymphadenopathy CARDIAC: RRR, 3/6 SM, no rubs, gallops RESPIRATORY:  Clear to auscultation without rales, wheezing or rhonchi  ABDOMEN: Soft, non-tender, non-distended MUSCULOSKELETAL:  No edema; No deformity  SKIN: Warm and dry NEUROLOGIC:  Alert and oriented x 3 PSYCHIATRIC:  Normal affect   ASSESSMENT:    1. Abnormal cardiovascular stress test   2. Nonrheumatic aortic valve stenosis   3. Pre-op examination   4. Coronary artery disease involving native coronary artery of native heart with unstable angina pectoris ([place])   5. CLL (chronic lymphocytic leukemia) (HCC)    PLAN:    In order of problems listed above:  Highly abnormal, high risk nuclear stress test - Large area of anterolateral ischemia identified.  Post bypass.  Had symptoms of shortness of breath and pressure which she thought was like his gallbladder.  We are setting him up for cardiac catheterization, left and right heart catheterization to evaluate his CAD, bypass grafts as well as his aortic stenosis.  Risks and benefits including stroke  heart attack death renal impairment bleeding have been discussed.  He is willing to proceed.  [place] is [place]  CABG in 2017 -Aortic valve was not replaced at that time.  He states he had mild aortic stenosis.  Murmur appreciated on exam today.  Evaluate with right and left.  Echocardiogram pending.  Aortic stenosis -Awaiting read of echocardiogram.  This can be  evaluated with heart catheterization as well.  This was not replaced during his bypass surgery in 2017.  Mild gradient.  Hyperlipidemia -Pravastatin was just started.  He had trouble with other statins.  CLL -Stable.  Last white count was 11.6.  I expressed the importance of him having this cardiac catheterization.  He understands.  Medication Adjustments/Labs and Tests Ordered: Current medicines are reviewed at length with the patient today.  Concerns regarding medicines are outlined above.  Orders Placed This Encounter  Procedures  . CBC  . Basic metabolic panel  . Protime-INR   No orders of the defined types were placed in this encounter.   Signed, Candee Furbish, MD  12/31/2017 1:02 PM    [facility]

## 2017-12-31 NOTE — Progress Notes (Signed)
Cardiology Office Note:    Date:  12/31/2017   ID:  James Hammond, DOB Dec 26, 1940, MRN [id number]  PCP:  Street, [name], MD  Cardiologist:  No primary care provider on file. Jenne Campus MD  Referring MD: Street, [name], *     History of Present Illness:    James Hammond is a 76 y.o. male here in our office today as I am DOD for nuclear stress test.  His nuclear stress test was markedly abnormal, high risk with anterolateral ischemia large territory.  EKG markedly abnormal as well with ST segment depressions consistent with ischemia.  He is post bypass surgery in 2017.  He had discomfort on the treadmill.  He is now chest pain-free.  He thought that his discomfort was secondary to his hiatal hernia and gallbladder disease.  He also has an aortic stenosis murmur and prior to his bypass had a mild gradient.  It was not replaced.  Echocardiogram was also performed today, results pending.  Pravastatin was started at last visit.  Denies any fevers chills nausea vomiting syncope.  Past Medical History:  Diagnosis Date  . Arthritis    back   . Cancer (HCC)    Chronic Lymphatic Luekemia  . Gallstone   . GERD (gastroesophageal reflux disease)   . Heart murmur    told recently that he has a slight murmur   . Hepatitis    as a child   . Hiatal hernia   . Hyperlipidemia   . Hypertension   . Hypothyroidism    doesn't need treatment at [facility] s point   . Kidney cysts   . Kidney cysts   . Kidney stone   . Pilonidal cyst   . Rectal abscess 1968  . Urticaria     Past Surgical History:  Procedure Laterality Date  . APPENDECTOMY     77 y.o.  . CARDIAC CATHETERIZATION    . CARDIAC SURGERY  06/2016   quadruple bypass  . CORONARY ARTERY BYPASS GRAFT    . LUMBAR LAMINECTOMY/DECOMPRESSION MICRODISCECTOMY Right 05/13/2015   Procedure: Microdiscectomy - Lumbar three--four  Lumbar four-five - right;  Surgeon: Kary Kos, MD;  Location: [place] [facility];  Service:  Neurosurgery;  Laterality: Right;  . PILONIDAL CYST EXCISION     required 30 day hosp. due to  (infection) being in the [employer] [place]   . rectal abscess  1960's    Current Medications: No outpatient medications have been marked as taking for the 12/31/17 encounter (Office Visit) with Jerline Pain, MD.     Allergies:   Protamine   Social History   Socioeconomic History  . Marital status: Married    Spouse name: Not on file  . Number of children: Not on file  . Years of education: Not on file  . Highest education level: Not on file  Occupational History  . Not on file  Social Needs  . Financial resource strain: Not on file  . Food insecurity:    Worry: Not on file    Inability: Not on file  . Transportation needs:    Medical: Not on file    Non-medical: Not on file  Tobacco Use  . Smoking status: Former Smoker    Last attempt to quit: 05/09/1983    Years since quitting: 34.6  . Smokeless tobacco: Never Used  Substance and Sexual Activity  . Alcohol use: No  . Drug use: No  . Sexual activity: Not on  file  Lifestyle  . Physical activity:    Days per week: Not on file    Minutes per session: Not on file  . Stress: Not on file  Relationships  . Social connections:    Talks on phone: Not on file    Gets together: Not on file    Attends religious service: Not on file    Active member of club or organization: Not on file    Attends meetings of clubs or organizations: Not on file    Relationship status: Not on file  Other Topics Concern  . Not on file  Social History Narrative  . Not on file     Family History: The patient's family history includes Cancer in his sister; Heart attack in his mother; Prostate cancer in his brother; Stroke in his brother and father. There is no history of Allergic rhinitis, Angioedema, Asthma, Atopy, Eczema, or Immunodeficiency.  ROS:   Please see the history of present illness.     All other systems reviewed and  are negative.  EKGs/Labs/Other Studies Reviewed:    The following studies were reviewed today: Nuclear stress test, EKGs  EKG: With nuclear stress test, market ST segment depression.  He did have a 4-second episode of what looks like atrial flutter transiently that broke.  Recent Labs: 03/27/2017: ALT 16; BUN 14; Creatinine, Ser 1.07; Hemoglobin 14.3; Platelets 224; Potassium 5.3; Sodium 142; TSH 3.530  Recent Lipid Panel No results found for: CHOL, TRIG, HDL, CHOLHDL, VLDL, LDLCALC, LDLDIRECT  Physical Exam:    VS:  BP (!) 165/73   Pulse (!) 59   Ht 5\' 7"  (1.702 m)   Wt 199 lb (90.3 kg)   BMI 31.17 kg/m     Wt Readings from Last 3 Encounters:  12/31/17 199 lb (90.3 kg)  12/31/17 199 lb (90.3 kg)  12/20/17 199 lb 6.4 oz (90.4 kg)     GEN:  Well nourished, well developed in no acute distress, overweight HEENT: Normal NECK: No JVD; No carotid bruits LYMPHATICS: No lymphadenopathy CARDIAC: RRR, 3/6 SM, no rubs, gallops RESPIRATORY:  Clear to auscultation without rales, wheezing or rhonchi  ABDOMEN: Soft, non-tender, non-distended MUSCULOSKELETAL:  No edema; No deformity  SKIN: Warm and dry NEUROLOGIC:  Alert and oriented x 3 PSYCHIATRIC:  Normal affect   ASSESSMENT:    1. Abnormal cardiovascular stress test   2. Nonrheumatic aortic valve stenosis   3. Pre-op examination   4. Coronary artery disease involving native coronary artery of native heart with unstable angina pectoris ([place])   5. CLL (chronic lymphocytic leukemia) (HCC)    PLAN:    In order of problems listed above:  Highly abnormal, high risk nuclear stress test - Large area of anterolateral ischemia identified.  Post bypass.  Had symptoms of shortness of breath and pressure which she thought was like his gallbladder.  We are setting him up for cardiac catheterization, left and right heart catheterization to evaluate his CAD, bypass grafts as well as his aortic stenosis.  Risks and benefits including stroke  heart attack death renal impairment bleeding have been discussed.  He is willing to proceed.  [place] is a [place]  CABG in 2017 -Aortic valve was not replaced at that time.  He states he had mild aortic stenosis.  Murmur appreciated on exam today.  Evaluate with right and left.  Echocardiogram pending.  Aortic stenosis -Awaiting read of echocardiogram.  This can be  evaluated with heart catheterization as well.  This was not replaced during his bypass surgery in 2017.  Mild gradient.  Hyperlipidemia -Pravastatin was just started.  He had trouble with other statins.  CLL -Stable.  Last white count was 11.6.  I expressed the importance of him having this cardiac catheterization.  He understands.  Medication Adjustments/Labs and Tests Ordered: Current medicines are reviewed at length with the patient today.  Concerns regarding medicines are outlined above.  Orders Placed This Encounter  Procedures  . CBC  . Basic metabolic panel  . Protime-INR   No orders of the defined types were placed in this encounter.   Signed, Candee Furbish, MD  12/31/2017 1:02 PM    [facility]

## 2017-12-31 NOTE — Patient Instructions (Signed)
Medication Instructions:  The current medical regimen is effective;  continue present plan and medications.  Labwork: Please have blood work today (CBC, BMP and PT/INR)  Testing/Procedures: Your physician has requested that you have a cardiac catheterization. Cardiac catheterization is used to diagnose and/or treat various heart conditions. Doctors may recommend this procedure for a number of different reasons. The most common reason is to evaluate chest pain. Chest pain can be a symptom of coronary artery disease (CAD), and cardiac catheterization can show whether plaque is narrowing or blocking your heart's arteries. This procedure is also used to evaluate the valves, as well as measure the blood flow and oxygen levels in different parts of your heart. For further information please visit [email or web address]. Please follow instruction sheet, as given.  Follow-Up: Follow up as instructed after your cardiac cath.  If you need a refill on your cardiac medications before your next appointment, please call your pharmacy.  Thank you for choosing [facility]!!

## 2018-01-01 ENCOUNTER — Other Ambulatory Visit: Payer: Self-pay

## 2018-01-01 ENCOUNTER — Ambulatory Visit (HOSPITAL_COMMUNITY)
Admission: RE | Admit: 2018-01-01 | Discharge: 2018-01-02 | Disposition: A | Discharge status: Home / Self Care | Payer: Medicare Other | Source: Ambulatory Visit | Attending: Cardiovascular Disease | Admitting: Cardiovascular Disease

## 2018-01-01 ENCOUNTER — Encounter (HOSPITAL_COMMUNITY): Payer: Self-pay | Admitting: Cardiovascular Disease

## 2018-01-01 ENCOUNTER — Encounter (HOSPITAL_COMMUNITY)
Admission: RE | Disposition: A | Discharge status: Home / Self Care | Payer: Self-pay | Source: Ambulatory Visit | Attending: Cardiovascular Disease

## 2018-01-01 DIAGNOSIS — I10 Essential (primary) hypertension: Secondary | ICD-10-CM | POA: Diagnosis not present

## 2018-01-01 DIAGNOSIS — Z955 Presence of coronary angioplasty implant and graft: Secondary | ICD-10-CM | POA: Insufficient documentation

## 2018-01-01 DIAGNOSIS — L509 Urticaria, unspecified: Secondary | ICD-10-CM | POA: Diagnosis not present

## 2018-01-01 DIAGNOSIS — K219 Gastro-esophageal reflux disease without esophagitis: Secondary | ICD-10-CM | POA: Diagnosis not present

## 2018-01-01 DIAGNOSIS — Z7982 Long term (current) use of aspirin: Secondary | ICD-10-CM | POA: Insufficient documentation

## 2018-01-01 DIAGNOSIS — I350 Nonrheumatic aortic (valve) stenosis: Secondary | ICD-10-CM | POA: Diagnosis not present

## 2018-01-01 DIAGNOSIS — E039 Hypothyroidism, unspecified: Secondary | ICD-10-CM | POA: Insufficient documentation

## 2018-01-01 DIAGNOSIS — Z809 Family history of malignant neoplasm, unspecified: Secondary | ICD-10-CM | POA: Insufficient documentation

## 2018-01-01 DIAGNOSIS — K449 Diaphragmatic hernia without obstruction or gangrene: Secondary | ICD-10-CM | POA: Insufficient documentation

## 2018-01-01 DIAGNOSIS — N281 Cyst of kidney, acquired: Secondary | ICD-10-CM | POA: Diagnosis not present

## 2018-01-01 DIAGNOSIS — I25110 Atherosclerotic heart disease of native coronary artery with unstable angina pectoris: Secondary | ICD-10-CM | POA: Diagnosis not present

## 2018-01-01 DIAGNOSIS — I2089 Other forms of angina pectoris: Secondary | ICD-10-CM

## 2018-01-01 DIAGNOSIS — I25810 Atherosclerosis of coronary artery bypass graft(s) without angina pectoris: Secondary | ICD-10-CM

## 2018-01-01 DIAGNOSIS — Z87891 Personal history of nicotine dependence: Secondary | ICD-10-CM | POA: Diagnosis not present

## 2018-01-01 DIAGNOSIS — Z87442 Personal history of urinary calculi: Secondary | ICD-10-CM | POA: Insufficient documentation

## 2018-01-01 DIAGNOSIS — C9110 Chronic lymphocytic leukemia of B-cell type not having achieved remission: Secondary | ICD-10-CM | POA: Insufficient documentation

## 2018-01-01 DIAGNOSIS — Z79899 Other long term (current) drug therapy: Secondary | ICD-10-CM | POA: Diagnosis not present

## 2018-01-01 DIAGNOSIS — E782 Mixed hyperlipidemia: Secondary | ICD-10-CM | POA: Diagnosis present

## 2018-01-01 DIAGNOSIS — Z823 Family history of stroke: Secondary | ICD-10-CM | POA: Diagnosis not present

## 2018-01-01 DIAGNOSIS — Z8249 Family history of ischemic heart disease and other diseases of the circulatory system: Secondary | ICD-10-CM | POA: Insufficient documentation

## 2018-01-01 DIAGNOSIS — Z951 Presence of aortocoronary bypass graft: Secondary | ICD-10-CM | POA: Diagnosis not present

## 2018-01-01 DIAGNOSIS — Z888 Allergy status to other drugs, medicaments and biological substances status: Secondary | ICD-10-CM | POA: Diagnosis not present

## 2018-01-01 DIAGNOSIS — M479 Spondylosis, unspecified: Secondary | ICD-10-CM | POA: Diagnosis not present

## 2018-01-01 DIAGNOSIS — I208 Other forms of angina pectoris: Secondary | ICD-10-CM | POA: Diagnosis present

## 2018-01-01 DIAGNOSIS — Z8042 Family history of malignant neoplasm of prostate: Secondary | ICD-10-CM | POA: Diagnosis not present

## 2018-01-01 HISTORY — DX: Personal history of urinary calculi: Z87.442

## 2018-01-01 HISTORY — PX: CORONARY STENT INTERVENTION: CATH118234

## 2018-01-01 HISTORY — DX: Other forms of angina pectoris: I20.89

## 2018-01-01 HISTORY — PX: RIGHT/LEFT HEART CATH AND CORONARY/GRAFT ANGIOGRAPHY: CATH118267

## 2018-01-01 HISTORY — DX: Atherosclerotic heart disease of native coronary artery without angina pectoris: I25.10

## 2018-01-01 HISTORY — DX: Chronic lymphocytic leukemia of B-cell type not having achieved remission: C91.10

## 2018-01-01 HISTORY — DX: Other forms of angina pectoris: I20.8

## 2018-01-01 LAB — POCT I-STAT 3, VENOUS BLOOD GAS (G3P V)
Acid-base deficit: 1 mmol/L (ref 0.0–2.0)
Acid-base deficit: 2 mmol/L (ref 0.0–2.0)
Bicarbonate: 23.8 mmol/L (ref 20.0–28.0)
Bicarbonate: 24.9 mmol/L (ref 20.0–28.0)
O2 Saturation: 68 %
O2 Saturation: 69 %
PCO2 VEN: 43.9 mmHg — AB (ref 44.0–60.0)
TCO2: 25 mmol/L (ref 22–32)
TCO2: 26 mmol/L (ref 22–32)
pCO2, Ven: 44.9 mmHg (ref 44.0–60.0)
pH, Ven: 7.342 (ref 7.250–7.430)
pH, Ven: 7.353 (ref 7.250–7.430)
pO2, Ven: 38 mmHg (ref 32.0–45.0)
pO2, Ven: 38 mmHg (ref 32.0–45.0)

## 2018-01-01 LAB — POCT I-STAT 3, ART BLOOD GAS (G3+)
ACID-BASE DEFICIT: 2 mmol/L (ref 0.0–2.0)
BICARBONATE: 22.9 mmol/L (ref 20.0–28.0)
O2 Saturation: 95 %
PO2 ART: 78 mmHg — AB (ref 83.0–108.0)
TCO2: 24 mmol/L (ref 22–32)
pCO2 arterial: 39.7 mmHg (ref 32.0–48.0)
pH, Arterial: 7.37 (ref 7.350–7.450)

## 2018-01-01 LAB — POCT ACTIVATED CLOTTING TIME
ACTIVATED CLOTTING TIME: 411 s
ACTIVATED CLOTTING TIME: 169 s

## 2018-01-01 SURGERY — RIGHT/LEFT HEART CATH AND CORONARY/GRAFT ANGIOGRAPHY
Anesthesia: LOCAL

## 2018-01-01 MED ORDER — LIDOCAINE HCL (PF) 1 % IJ SOLN
INTRAMUSCULAR | Status: DC | PRN
  Administered 2018-01-01: 2 mL via INTRADERMAL
  Administered 2018-01-01: 10 mL via INTRADERMAL
  Administered 2018-01-01: 12 mL via INTRADERMAL

## 2018-01-01 MED ORDER — LIDOCAINE HCL 1 % IJ SOLN
INTRAMUSCULAR | Status: AC
  Filled 2018-01-01: qty 20

## 2018-01-01 MED ORDER — SODIUM CHLORIDE 0.9 % IV SOLN
INTRAVENOUS | Status: DC | PRN
  Administered 2018-01-01: 1.75 mg/kg/h via INTRAVENOUS

## 2018-01-01 MED ORDER — SODIUM CHLORIDE 0.9 % IV SOLN
INTRAVENOUS | Status: AC | PRN
  Administered 2018-01-01: 10 mL/h via INTRAVENOUS

## 2018-01-01 MED ORDER — FAMOTIDINE 20 MG PO TABS
10.0000 mg | ORAL_TABLET | Freq: Two times a day (BID) | ORAL | Status: DC
  Administered 2018-01-01 – 2018-01-02 (×2): 10 mg via ORAL
  Filled 2018-01-01 (×2): qty 1

## 2018-01-01 MED ORDER — CLOPIDOGREL BISULFATE 300 MG PO TABS
ORAL_TABLET | ORAL | Status: DC | PRN
  Administered 2018-01-01: 600 mg via ORAL

## 2018-01-01 MED ORDER — IOHEXOL 350 MG/ML SOLN
INTRAVENOUS | Status: DC | PRN
  Administered 2018-01-01: 195 mL via INTRA_ARTERIAL

## 2018-01-01 MED ORDER — PRAVASTATIN SODIUM 40 MG PO TABS
20.0000 mg | ORAL_TABLET | Freq: Every evening | ORAL | Status: DC
  Administered 2018-01-01: 19:00:00 20 mg via ORAL
  Filled 2018-01-01: qty 1

## 2018-01-01 MED ORDER — BIVALIRUDIN TRIFLUOROACETATE 250 MG IV SOLR
INTRAVENOUS | Status: AC
  Filled 2018-01-01: qty 250

## 2018-01-01 MED ORDER — SODIUM CHLORIDE 0.9 % WEIGHT BASED INFUSION
1.0000 mL/kg/h | INTRAVENOUS | Status: AC
  Administered 2018-01-01: 1 mL/kg/h via INTRAVENOUS

## 2018-01-01 MED ORDER — BIVALIRUDIN BOLUS VIA INFUSION - CUPID
INTRAVENOUS | Status: DC | PRN
  Administered 2018-01-01: 67.725 mg via INTRAVENOUS

## 2018-01-01 MED ORDER — SODIUM CHLORIDE 0.9% FLUSH: 3.0000 mL | Freq: Two times a day (BID) | INTRAVENOUS | Status: DC

## 2018-01-01 MED ORDER — ASPIRIN 81 MG PO CHEW: 81.0000 mg | CHEWABLE_TABLET | ORAL | Status: DC

## 2018-01-01 MED ORDER — SODIUM CHLORIDE 0.9 % IV SOLN: 250.0000 mL | INTRAVENOUS | Status: DC | PRN

## 2018-01-01 MED ORDER — ANGIOPLASTY BOOK
Freq: Once | Status: AC
  Administered 2018-01-01: 22:00:00 1
  Filled 2018-01-01: qty 1

## 2018-01-01 MED ORDER — SODIUM CHLORIDE 0.9% FLUSH: 3.0000 mL | INTRAVENOUS | Status: DC | PRN

## 2018-01-01 MED ORDER — ONDANSETRON HCL 4 MG/2ML IJ SOLN
4.0000 mg | Freq: Four times a day (QID) | INTRAMUSCULAR | Status: DC | PRN
Start: 2018-01-01 — End: 2018-01-02

## 2018-01-01 MED ORDER — CLOPIDOGREL BISULFATE 75 MG PO TABS
75.0000 mg | ORAL_TABLET | Freq: Every day | ORAL | Status: DC
  Administered 2018-01-02: 75 mg via ORAL
  Filled 2018-01-01: qty 1

## 2018-01-01 MED ORDER — CLOPIDOGREL BISULFATE 300 MG PO TABS
ORAL_TABLET | ORAL | Status: AC
  Filled 2018-01-01: qty 1

## 2018-01-01 MED ORDER — ACETAMINOPHEN 325 MG PO TABS: 650.0000 mg | ORAL_TABLET | ORAL | Status: DC | PRN

## 2018-01-01 MED ORDER — VERAPAMIL HCL 2.5 MG/ML IV SOLN
INTRAVENOUS | Status: AC
  Filled 2018-01-01: qty 2

## 2018-01-01 MED ORDER — LEVOTHYROXINE SODIUM 25 MCG PO TABS: 25.0000 ug | ORAL_TABLET | ORAL | Status: DC

## 2018-01-01 MED ORDER — CLOPIDOGREL BISULFATE 300 MG PO TABS
ORAL_TABLET | ORAL | Status: AC
  Filled 2018-01-01: qty 2

## 2018-01-01 MED ORDER — FENTANYL CITRATE (PF) 100 MCG/2ML IJ SOLN
INTRAMUSCULAR | Status: AC
  Filled 2018-01-01: qty 2

## 2018-01-01 MED ORDER — HEPARIN (PORCINE) IN NACL 2-0.9 UNIT/ML-% IJ SOLN
INTRAMUSCULAR | Status: AC
  Filled 2018-01-01: qty 1000

## 2018-01-01 MED ORDER — HYDRALAZINE HCL 20 MG/ML IJ SOLN
5.0000 mg | INTRAMUSCULAR | Status: AC | PRN
  Administered 2018-01-01: 16:00:00 5 mg via INTRAVENOUS
  Filled 2018-01-01: qty 1

## 2018-01-01 MED ORDER — METOPROLOL SUCCINATE ER 25 MG PO TB24
25.0000 mg | ORAL_TABLET | Freq: Every day | ORAL | Status: DC
  Filled 2018-01-01 (×2): qty 1

## 2018-01-01 MED ORDER — HEPARIN (PORCINE) IN NACL 2-0.9 UNIT/ML-% IJ SOLN
INTRAMUSCULAR | Status: AC | PRN
  Administered 2018-01-01 (×2): 500 mL

## 2018-01-01 MED ORDER — SODIUM CHLORIDE 0.9 % WEIGHT BASED INFUSION
3.0000 mL/kg/h | INTRAVENOUS | Status: DC
  Administered 2018-01-01: 3 mL/kg/h via INTRAVENOUS

## 2018-01-01 MED ORDER — FENTANYL CITRATE (PF) 100 MCG/2ML IJ SOLN
INTRAMUSCULAR | Status: DC | PRN
  Administered 2018-01-01: 25 ug via INTRAVENOUS

## 2018-01-01 MED ORDER — LEVOTHYROXINE SODIUM 50 MCG PO TABS
50.0000 ug | ORAL_TABLET | ORAL | Status: DC
  Administered 2018-01-02: 06:00:00 50 ug via ORAL
  Filled 2018-01-01: qty 1

## 2018-01-01 MED ORDER — MIDAZOLAM HCL 2 MG/2ML IJ SOLN
INTRAMUSCULAR | Status: DC | PRN
  Administered 2018-01-01: 1 mg via INTRAVENOUS

## 2018-01-01 MED ORDER — MIDAZOLAM HCL 2 MG/2ML IJ SOLN
INTRAMUSCULAR | Status: AC
  Filled 2018-01-01: qty 2

## 2018-01-01 MED ORDER — ASPIRIN EC 81 MG PO TBEC
81.0000 mg | DELAYED_RELEASE_TABLET | Freq: Every day | ORAL | Status: DC
  Administered 2018-01-02: 81 mg via ORAL
  Filled 2018-01-01: qty 1

## 2018-01-01 MED ORDER — SODIUM CHLORIDE 0.9 % WEIGHT BASED INFUSION: 1.0000 mL/kg/h | INTRAVENOUS | Status: DC

## 2018-01-01 MED ORDER — LABETALOL HCL 5 MG/ML IV SOLN
10.0000 mg | INTRAVENOUS | Status: AC | PRN
  Filled 2018-01-01: qty 4

## 2018-01-01 SURGICAL SUPPLY — 25 items
BALLN SAPPHIRE 2.5X12 (BALLOONS) ×2
BALLOON SAPPHIRE 2.5X12 (BALLOONS) ×1 IMPLANT
CATH INFINITI 5 FR AL2 (CATHETERS) ×2 IMPLANT
CATH INFINITI 5FR AL1 (CATHETERS) ×2 IMPLANT
CATH INFINITI 5FR MULTPACK ANG (CATHETERS) ×2 IMPLANT
CATH SWAN GANZ 7F STRAIGHT (CATHETERS) ×2 IMPLANT
CATHETER LAUNCHER 6FR LCB (CATHETERS) ×2 IMPLANT
DEVICE CLOSURE MYNXGRIP 6/7F (Vascular Products) ×2 IMPLANT
DEVICE CONTINUOUS FLUSH (MISCELLANEOUS) ×2 IMPLANT
GLIDESHEATH SLEND SS 6F .021 (SHEATH) ×2 IMPLANT
GUIDEWIRE INQWIRE 1.5J.035X260 (WIRE) ×1 IMPLANT
INQWIRE 1.5J .035X260CM (WIRE) ×2
KIT ENCORE 26 ADVANTAGE (KITS) ×2 IMPLANT
KIT HEART LEFT (KITS) ×2 IMPLANT
NEEDLE PERC 21GX4CM (NEEDLE) ×2 IMPLANT
PACK CARDIAC CATHETERIZATION (CUSTOM PROCEDURE TRAY) ×2 IMPLANT
SHEATH AVANTI 11CM 5FR (SHEATH) ×2 IMPLANT
SHEATH AVANTI 11CM 6FR (SHEATH) ×2 IMPLANT
SHEATH AVANTI 11CM 7FR (SHEATH) ×2 IMPLANT
SHEATH RAIN RADIAL 21G 6FR (SHEATH) ×2 IMPLANT
STENT RESOLUTE ONYX 2.5X15 (Permanent Stent) ×2 IMPLANT
TRANSDUCER W/STOPCOCK (MISCELLANEOUS) ×2 IMPLANT
TUBING CIL FLEX 10 FLL-RA (TUBING) ×2 IMPLANT
WIRE EMERALD 3MM-J .035X150CM (WIRE) ×2 IMPLANT
WIRE RUNTHROUGH .014X180CM (WIRE) ×2 IMPLANT

## 2018-01-01 NOTE — Progress Notes (Signed)
Site area: Right groin a 7 french venous sheath was removed  Site Prior to Removal:  Level 0  Pressure Applied For 15 MINUTES    Bedrest Beginning at 1345p  Manual:   Yes.    Patient Status During Pull:  stable  Post Pull Groin Site:  Level 0  Post Pull Instructions Given:  Yes.    Post Pull Pulses Present:  Yes.    Dressing Applied:  Yes.    Comments:   VS remain stable

## 2018-01-01 NOTE — Interval H&P Note (Signed)
Cath Lab Visit (complete for each Cath Lab visit)  Clinical Evaluation Leading to the Procedure:   ACS: No.  Non-ACS:    Anginal Classification: CCS III  Anti-ischemic medical therapy: Minimal Therapy (1 class of medications)  Non-Invasive Test Results: High-risk stress test findings: cardiac mortality >3%/year  Prior CABG: Previous CABG      History and Physical Interval Note:  01/01/2018 9:53 AM  James Hammond  has presented today for surgery, with the diagnosis of abn stress   as  The various methods of treatment have been discussed with the patient and family. After consideration of risks, benefits and other options for treatment, the patient has consented to  Procedure(s): RIGHT/LEFT HEART CATH AND CORONARY/GRAFT ANGIOGRAPHY (N/A) as a surgical intervention .  The patient's history has been reviewed, patient examined, no change in status, stable for surgery.  I have reviewed the patient's chart and labs.  Questions were answered to the patient's satisfaction.     Kathlyn Sacramento

## 2018-01-02 DIAGNOSIS — Z79899 Other long term (current) drug therapy: Secondary | ICD-10-CM | POA: Diagnosis not present

## 2018-01-02 DIAGNOSIS — E039 Hypothyroidism, unspecified: Secondary | ICD-10-CM | POA: Diagnosis not present

## 2018-01-02 DIAGNOSIS — Z7982 Long term (current) use of aspirin: Secondary | ICD-10-CM | POA: Diagnosis not present

## 2018-01-02 DIAGNOSIS — Z87891 Personal history of nicotine dependence: Secondary | ICD-10-CM | POA: Diagnosis not present

## 2018-01-02 DIAGNOSIS — Z87442 Personal history of urinary calculi: Secondary | ICD-10-CM | POA: Diagnosis not present

## 2018-01-02 DIAGNOSIS — I208 Other forms of angina pectoris: Secondary | ICD-10-CM

## 2018-01-02 DIAGNOSIS — I25110 Atherosclerotic heart disease of native coronary artery with unstable angina pectoris: Secondary | ICD-10-CM | POA: Diagnosis not present

## 2018-01-02 DIAGNOSIS — L509 Urticaria, unspecified: Secondary | ICD-10-CM | POA: Diagnosis not present

## 2018-01-02 DIAGNOSIS — Z823 Family history of stroke: Secondary | ICD-10-CM | POA: Diagnosis not present

## 2018-01-02 DIAGNOSIS — Z951 Presence of aortocoronary bypass graft: Secondary | ICD-10-CM | POA: Diagnosis not present

## 2018-01-02 DIAGNOSIS — E782 Mixed hyperlipidemia: Secondary | ICD-10-CM | POA: Diagnosis not present

## 2018-01-02 DIAGNOSIS — C9110 Chronic lymphocytic leukemia of B-cell type not having achieved remission: Secondary | ICD-10-CM | POA: Diagnosis not present

## 2018-01-02 DIAGNOSIS — Z888 Allergy status to other drugs, medicaments and biological substances status: Secondary | ICD-10-CM | POA: Diagnosis not present

## 2018-01-02 DIAGNOSIS — Z955 Presence of coronary angioplasty implant and graft: Secondary | ICD-10-CM | POA: Diagnosis not present

## 2018-01-02 DIAGNOSIS — Z809 Family history of malignant neoplasm, unspecified: Secondary | ICD-10-CM | POA: Diagnosis not present

## 2018-01-02 DIAGNOSIS — Z8249 Family history of ischemic heart disease and other diseases of the circulatory system: Secondary | ICD-10-CM | POA: Diagnosis not present

## 2018-01-02 DIAGNOSIS — I350 Nonrheumatic aortic (valve) stenosis: Secondary | ICD-10-CM | POA: Diagnosis not present

## 2018-01-02 DIAGNOSIS — Z8042 Family history of malignant neoplasm of prostate: Secondary | ICD-10-CM | POA: Diagnosis not present

## 2018-01-02 DIAGNOSIS — K219 Gastro-esophageal reflux disease without esophagitis: Secondary | ICD-10-CM | POA: Diagnosis not present

## 2018-01-02 DIAGNOSIS — I10 Essential (primary) hypertension: Secondary | ICD-10-CM | POA: Diagnosis not present

## 2018-01-02 DIAGNOSIS — M479 Spondylosis, unspecified: Secondary | ICD-10-CM | POA: Diagnosis not present

## 2018-01-02 DIAGNOSIS — N281 Cyst of kidney, acquired: Secondary | ICD-10-CM | POA: Diagnosis not present

## 2018-01-02 DIAGNOSIS — K449 Diaphragmatic hernia without obstruction or gangrene: Secondary | ICD-10-CM | POA: Diagnosis not present

## 2018-01-02 LAB — CBC
HCT: 37.3 % — ABNORMAL LOW (ref 39.0–52.0)
Hemoglobin: 12.4 g/dL — ABNORMAL LOW (ref 13.0–17.0)
MCH: 29.9 pg (ref 26.0–34.0)
MCHC: 33.2 g/dL (ref 30.0–36.0)
MCV: 89.9 fL (ref 78.0–100.0)
PLATELETS: 182 10*3/uL (ref 150–400)
RBC: 4.15 MIL/uL — AB (ref 4.22–5.81)
RDW: 14.6 % (ref 11.5–15.5)
WBC: 8.8 10*3/uL (ref 4.0–10.5)

## 2018-01-02 LAB — BASIC METABOLIC PANEL
Anion gap: 9 (ref 5–15)
BUN: 12 mg/dL (ref 6–20)
CALCIUM: 8.9 mg/dL (ref 8.9–10.3)
CO2: 21 mmol/L — AB (ref 22–32)
CREATININE: 1.23 mg/dL (ref 0.61–1.24)
Chloride: 109 mmol/L (ref 101–111)
GFR calc non Af Amer: 56 mL/min — ABNORMAL LOW (ref >60)
Glucose, Bld: 110 mg/dL — ABNORMAL HIGH (ref 65–99)
Potassium: 3.9 mmol/L (ref 3.5–5.1)
SODIUM: 139 mmol/L (ref 135–145)

## 2018-01-02 MED ORDER — CLOPIDOGREL BISULFATE 75 MG PO TABS: 75.0000 mg | ORAL_TABLET | Freq: Every day | ORAL | 2 refills | Status: DC

## 2018-01-02 MED ORDER — NITROGLYCERIN 0.4 MG SL SUBL: 0.4000 mg | SUBLINGUAL_TABLET | SUBLINGUAL | 2 refills | Status: DC | PRN

## 2018-01-02 NOTE — Discharge Summary (Signed)
Discharge Summary    Patient ID: James Hammond,  MRN: [id number], DOB/AGE: March 24, 1941 77 y.o.  Admit date: 01/01/2018 Discharge date: 01/02/2018  Primary Care Provider: Emmaline Kluver Primary Cardiologist: Dr. Agustin Cree  Discharge Diagnoses    Active Problems:   Effort angina [facility])   Benign hypertension   Coronary artery disease involving native coronary artery of native heart with unstable angina pectoris ([place])   Mixed hyperlipidemia   Allergies Allergies  Allergen Reactions  . Protamine Anaphylaxis    Diagnostic Studies/Procedures    Cath: 01/01/18  Conclusion     Ost LM lesion is 100% stenosed.  Ost RCA to Mid RCA lesion is 95% stenosed.  SVG graft was visualized by angiography and is normal in caliber.  LIMA graft was visualized by angiography and is normal in caliber.  The graft exhibits no disease.  Prox LAD lesion is 100% stenosed.  SVG graft was visualized by angiography.  Origin lesion is 95% stenosed.  A drug-eluting stent was successfully placed using a STENT RESOLUTE ONYX 2.5X15.  Post intervention, there is a 5% residual stenosis.   1.  Severe underlying three-vessel coronary artery disease with occluded left main coronary artery and severe diffuse disease affecting the right coronary artery.  Patent LIMA to LAD, SVG to RCA and SVG to OM 3.  I could not identify an SVG to diagonal but most of the diagonals are getting flow from LAD through the LIMA.  Severe ostial disease affecting SVG to OM 3.  The graft has very unusual origin from the right side of the aorta and the marker is placed at least 30 mm away from the origin.  Thus, engagement was extremely difficult. 2.  Right heart catheterization showed normal filling pressures, no significant pulmonary hypertension and normal cardiac output. 3.  Mild aortic stenosis with a mean gradient of less than 10 mmHg. 4.  Successful angioplasty and drug-eluting stent placement to SVG to OM  3.  Recommendations: Dual antiplatelet therapy for at least one year.  Aggressive treatment of risk factors.   _____________   History of Present Illness     77 y.o. male with PMH of CAD, HTN, HL, GERD, who presented to the office on 12/31/17 for a stress test.  His nuclear stress test was markedly abnormal, high risk with anterolateral ischemia large territory.  EKG markedly abnormal as well with ST segment depressions consistent with ischemia.  He is post bypass surgery in 2017.  He had discomfort on the treadmill.  He thought that his discomfort was secondary to his hiatal hernia and gallbladder disease.  He also has an aortic stenosis murmur and prior to his bypass had a mild gradient.  It was not replaced.  Echocardiogram showed normal EF with no WMA. Pravastatin was started at last visit. Given his abnormal stress test he was set up for outpatient cath.    Hospital Course     Underwent cardiac cath noted above with severe underlying 3v disease with patent LIMA-LAD, SVG-RCA and SVG -OM3. Did note severe ostial disease to SVG -OM3 with successful PCI/DES x1. Plan for DAPT with ASA/plavix. Right heart cath with normal filling pressures. Recent echo with normal EF. No complications noted overnight. Morning labs were stable. Worked well with cardiac rehab without recurrent chest pain.    General: Well developed, well nourished, male appearing in no acute distress. Head: Normocephalic, atraumatic.  Neck: Supple without bruits, JVD. Lungs:  Resp regular and unlabored, CTA. Heart: RRR, S1, S2,  harsh 2/6 systolic murmur; no rub. Abdomen: Soft, non-tender, non-distended with normoactive bowel sounds. No hepatomegaly. No rebound/guarding. No obvious abdominal masses. Extremities: No clubbing, cyanosis, edema. Distal pedal pulses are 2+ bilaterally. L radial/right femoral cath site stable without bruising or hematoma Neuro: Alert and oriented X 3. Moves all extremities spontaneously. Psych:  Normal affect.  Rosana Berger was seen by Dr. Angelena Form and determined stable for discharge home. Follow up in the office has been arranged. Medications are listed below.   _____________  Discharge Vitals Blood pressure (!) 136/54, pulse 71, temperature 98.1 F (36.7 C), temperature source Oral, resp. rate 19, height 5\' 7"  (1.702 m), weight 200 lb 6.4 oz (90.9 kg), SpO2 96 %.  Filed Weights   01/01/18 0634 01/02/18 0618  Weight: 199 lb (90.3 kg) 200 lb 6.4 oz (90.9 kg)    Labs & Radiologic Studies    CBC Recent Labs    12/31/17 1227 01/02/18 0304  WBC 10.9* 8.8  HGB 14.1 12.4*  HCT 41.2 37.3*  MCV 86 89.9  PLT 199 409   Basic Metabolic Panel Recent Labs    12/31/17 1227 01/02/18 0304  NA 138 139  K 4.9 3.9  CL 102 109  CO2 26 21*  GLUCOSE 112* 110*  BUN 17 12  CREATININE 1.25 1.23  CALCIUM 10.2 8.9   Liver Function Tests No results for input(s): AST, ALT, ALKPHOS, BILITOT, PROT, ALBUMIN in the last 72 hours. No results for input(s): LIPASE, AMYLASE in the last 72 hours. Cardiac Enzymes No results for input(s): CKTOTAL, CKMB, CKMBINDEX, TROPONINI in the last 72 hours. BNP Invalid input(s): POCBNP D-Dimer No results for input(s): DDIMER in the last 72 hours. Hemoglobin A1C No results for input(s): HGBA1C in the last 72 hours. Fasting Lipid Panel No results for input(s): CHOL, HDL, LDLCALC, TRIG, CHOLHDL, LDLDIRECT in the last 72 hours. Thyroid Function Tests No results for input(s): TSH, T4TOTAL, T3FREE, THYROIDAB in the last 72 hours.  Invalid input(s): FREET3 _____________  No results found. Disposition   Pt is being discharged home today in good condition.  Follow-up Plans & Appointments     Discharge Instructions    AMB Referral to Cardiac Rehabilitation - Phase II   Complete by:  As directed    Diagnosis:  Coronary Stents      Discharge Medications     Medication List    TAKE these medications   aspirin 81 MG tablet Take 81 mg by  mouth daily.   cetirizine 10 MG tablet Commonly known as:  ZYRTEC Take 1 tablet daily at bedtime   clopidogrel 75 MG tablet Commonly known as:  PLAVIX Take 1 tablet (75 mg total) by mouth daily with breakfast.   EPINEPHrine 0.3 mg/0.3 mL Soaj injection Commonly known as:  EPI-PEN AS NEEDED FOR SEVERE ALLERGIC REACTIONS   famotidine 10 MG tablet Commonly known as:  PEPCID Take 10 mg by mouth 2 (two) times daily.   levothyroxine 25 MCG tablet Commonly known as:  SYNTHROID, LEVOTHROID Take 25 mcg by mouth See admin instructions. 1 Daily except 2 on Thursday and Sunday   metoprolol succinate 25 MG 24 hr tablet Commonly known as:  TOPROL-XL Take 25 mg by mouth daily.   nitroGLYCERIN 0.4 MG SL tablet Commonly known as:  NITROSTAT Place 1 tablet (0.4 mg total) under the tongue every 5 (five) minutes as needed.   pravastatin 20 MG tablet Commonly known as:  PRAVACHOL Take 1 tablet (20 mg total) by mouth every evening.  Aspirin prescribed at discharge?  Yes High Intensity Statin Prescribed? (Lipitor 40-80mg  or Crestor 20-40mg ): No: intolerant. Consider PCSK9s as outpatient Beta Blocker Prescribed? Yes For EF <40%, was ACEI/ARB Prescribed? No: EF ok ADP Receptor Inhibitor Prescribed? (i.e. Plavix etc.-Includes Medically Managed Patients): Yes For EF <40%, Aldosterone Inhibitor Prescribed? No: EF ok Was EF assessed during THIS hospitalization? Yes Was Cardiac Rehab II ordered? (Included Medically managed Patients): Yes   Outstanding Labs/Studies   FLP/LFTs in 6 weeks if tolerating statin.   Duration of Discharge Encounter   Greater than 30 minutes including physician time.  Signed, Reino Bellis NP-C 01/02/2018, 8:31 AM  I have personally seen and examined this patient. I agree with the assessment and plan as outlined above.  He is doing well this am s/p DES x 1 in SVG to the OM3. NO chest pain. He is on ASA and Plavix.  Discharge home.   Lauree Chandler 01/02/2018 9:27 AM

## 2018-01-02 NOTE — Progress Notes (Signed)
CARDIAC REHAB PHASE I   PRE:  Rate/Rhythm: SR 77  BP:  Standing: 166/80   SaO2: 95% RA  MODE:  Ambulation: 500 ft   POST:  Rate/Rhythm: 96 ST  BP:  Sitting: 179/80     SaO2: 97% RA  7591-6384 Pt ambulated 537ft with steady gait. Education completed including stent card, RF modification, restrictions, and exercise guidelines.  Booklet reviewed with patient. Verbalized understanding. CRP2 Referral made to [facility] but pt states that it will be too far to drive for him.   Noel Christmas, RN 01/02/2018 9:08 AM

## 2018-01-03 MED FILL — Lidocaine HCl Local Inj 1%: INTRAMUSCULAR | Qty: 20 | Status: AC

## 2018-01-03 MED FILL — Heparin Sodium (Porcine) 2 Unit/ML in Sodium Chloride 0.9%: INTRAMUSCULAR | Qty: 1000 | Status: AC

## 2018-01-03 MED FILL — Verapamil HCl IV Soln 2.5 MG/ML: INTRAVENOUS | Qty: 2 | Status: AC

## 2018-01-13 ENCOUNTER — Encounter: Payer: Self-pay | Admitting: Cardiology

## 2018-01-13 ENCOUNTER — Ambulatory Visit: Payer: Medicare Other | Admitting: Cardiology

## 2018-01-13 VITALS — BP 130/64 | HR 65 | Ht 67.0 in | Wt 197.1 lb

## 2018-01-13 DIAGNOSIS — I10 Essential (primary) hypertension: Secondary | ICD-10-CM | POA: Diagnosis not present

## 2018-01-13 DIAGNOSIS — I350 Nonrheumatic aortic (valve) stenosis: Secondary | ICD-10-CM

## 2018-01-13 DIAGNOSIS — E782 Mixed hyperlipidemia: Secondary | ICD-10-CM

## 2018-01-13 DIAGNOSIS — Z951 Presence of aortocoronary bypass graft: Secondary | ICD-10-CM | POA: Diagnosis not present

## 2018-01-13 NOTE — Progress Notes (Signed)
Cardiology Office Note:    Date:  01/13/2018   ID:  James Hammond, DOB 1940/11/13, MRN [id number]  PCP:  Street, [name], MD  Cardiologist:  Jenne Campus, MD    Referring MD: Street, [name], *   Chief Complaint  Patient presents with  . Follow-up cath  I had cardiac catheterization  History of Present Illness:    James Hammond is a 77 y.o. male with coronary artery disease, dyslipidemia.  Presented to our office after recent hospitalization.  His symptoms were kind of unusual he was complaining of having gas.  Stress test was done which showed gross abnormality he was referred for cardiac catheterization which was performed it showed completely occluded graft going to the diagonal branch also significant stenosis involving graft going to obtuse marginal 3.  Drug-eluting stent was implanted in that artery.  He is coming here for follow-up.  He said he is doing well however he also tells me he was doing well before he said he cannot see any difference compared to before and after his recent angioplasty.  He is very active yesterday for example he was cutting grass he was weed eating he was working in the shop and then he went for about 3 times.  Denies having chest pain tightness squeezing pressure burning chest.  Still got the gas issue.  He saw a gastroenterologist and he was given some proton pump inhibitor.  He also does have a gallstone.  Past Medical History:  Diagnosis Date  . Arthritis    "left hip, lower back" (01/01/2018)  . CLL (chronic lymphocytic leukemia) ([place])    "never had tx for it" (01/01/2018)  . Coronary artery disease   . Gallstone   . GERD (gastroesophageal reflux disease)   . Heart murmur    told recently that he has a slight murmur   . Hepatitis    as a child   . Hiatal hernia   . History of kidney stones   . Hyperlipidemia   . Hypertension   . Hypothyroidism    doesn't need treatment at [facility] s point   . Kidney cysts   . Urticaria      Past Surgical History:  Procedure Laterality Date  . APPENDECTOMY     77 y.o.  . BACK SURGERY    . CARDIAC CATHETERIZATION  06/2016   "couldn't get thru so they did bypass OR"  . CORONARY ARTERY BYPASS GRAFT  06/2016   CABG X4  . CORONARY STENT INTERVENTION N/A 01/01/2018   Procedure: CORONARY STENT INTERVENTION;  Surgeon: Wellington Hampshire, MD;  Location: [place] CV LAB;  Service: Cardiovascular;  Laterality: N/A;  SVG to OM ostium  . INCISION AND DRAINAGE ABSCESS ANAL  1968  . LUMBAR LAMINECTOMY/DECOMPRESSION MICRODISCECTOMY Right 05/13/2015   Procedure: Microdiscectomy - Lumbar three--four  Lumbar four-five - right;  Surgeon: Kary Kos, MD;  Location: [place] NEURO ORS;  Service: Neurosurgery;  Laterality: Right;  . PILONIDAL CYST EXCISION  1965   required 30 day hosp. due to  (infection) being in the [employer] & [place]   . RIGHT/LEFT HEART CATH AND CORONARY/GRAFT ANGIOGRAPHY N/A 01/01/2018   Procedure: RIGHT/LEFT HEART CATH AND CORONARY/GRAFT ANGIOGRAPHY;  Surgeon: Wellington Hampshire, MD;  Location: [place] CV LAB;  Service: Cardiovascular;  Laterality: N/A;    Current Medications: Current Meds  Medication Sig  . aspirin 81 MG tablet Take 81 mg by mouth daily.  . cetirizine (ZYRTEC) 10 MG tablet Take  1 tablet daily at bedtime  . clopidogrel (PLAVIX) 75 MG tablet Take 1 tablet (75 mg total) by mouth daily with breakfast.  . EPINEPHrine 0.3 mg/0.3 mL IJ SOAJ injection AS NEEDED FOR SEVERE ALLERGIC REACTIONS  . famotidine (PEPCID) 10 MG tablet Take 10 mg by mouth 2 (two) times daily.  Marland Kitchen levothyroxine (SYNTHROID, LEVOTHROID) 25 MCG tablet Take 25 mcg by mouth See admin instructions. 1 Daily except 2 on Thursday and Sunday  . metoprolol succinate (TOPROL-XL) 25 MG 24 hr tablet Take 25 mg by mouth daily.  . nitroGLYCERIN (NITROSTAT) 0.4 MG SL tablet Place 1 tablet (0.4 mg total) under the tongue every 5 (five) minutes as needed.  . pravastatin (PRAVACHOL) 20 MG tablet  Take 1 tablet (20 mg total) by mouth every evening.     Allergies:   Protamine   Social History   Socioeconomic History  . Marital status: Married    Spouse name: Not on file  . Number of children: Not on file  . Years of education: Not on file  . Highest education level: Not on file  Occupational History  . Not on file  Social Needs  . Financial resource strain: Not on file  . Food insecurity:    Worry: Not on file    Inability: Not on file  . Transportation needs:    Medical: Not on file    Non-medical: Not on file  Tobacco Use  . Smoking status: Former Smoker    Packs/day: 1.00    Years: 25.00    Pack years: 25.00    Types: Cigarettes    Last attempt to quit: 05/09/1983    Years since quitting: 34.7  . Smokeless tobacco: Never Used  Substance and Sexual Activity  . Alcohol use: No  . Drug use: No  . Sexual activity: Yes  Lifestyle  . Physical activity:    Days per week: Not on file    Minutes per session: Not on file  . Stress: Not on file  Relationships  . Social connections:    Talks on phone: Not on file    Gets together: Not on file    Attends religious service: Not on file    Active member of club or organization: Not on file    Attends meetings of clubs or organizations: Not on file    Relationship status: Not on file  Other Topics Concern  . Not on file  Social History Narrative  . Not on file     Family History: The patient's family history includes Cancer in his sister; Heart attack in his mother; Prostate cancer in his brother; Stroke in his brother and father. There is no history of Allergic rhinitis, Angioedema, Asthma, Atopy, Eczema, or Immunodeficiency. ROS:   Please see the history of present illness.    All 14 point review of systems negative except as described per history of present illness  EKGs/Labs/Other Studies Reviewed:      Recent Labs: 03/27/2017: ALT 16; TSH 3.530 01/02/2018: BUN 12; Creatinine, Ser 1.23; Hemoglobin 12.4;  Platelets 182; Potassium 3.9; Sodium 139  Recent Lipid Panel No results found for: CHOL, TRIG, HDL, CHOLHDL, VLDL, LDLCALC, LDLDIRECT  Physical Exam:    VS:  BP 130/64   Pulse 65   Ht 5\' 7"  (1.702 m)   Wt 197 lb 1.9 oz (89.4 kg)   SpO2 98%   BMI 30.87 kg/m     Wt Readings from Last 3 Encounters:  01/13/18 197 lb 1.9 oz (89.4  kg)  01/02/18 200 lb 6.4 oz (90.9 kg)  12/31/17 199 lb (90.3 kg)     GEN:  Well nourished, well developed in no acute distress HEENT: Normal NECK: No JVD; No carotid bruits LYMPHATICS: No lymphadenopathy CARDIAC: RRR, systolic ejection murmur grade 2/6 best heard at the right upper portion of the sternum with radiation towards the neck, no rubs, no gallops RESPIRATORY:  Clear to auscultation without rales, wheezing or rhonchi  ABDOMEN: Soft, non-tender, non-distended MUSCULOSKELETAL:  No edema; No deformity  SKIN: Warm and dry LOWER EXTREMITIES: no swelling NEUROLOGIC:  Alert and oriented x 3 PSYCHIATRIC:  Normal affect   ASSESSMENT:    1. Nonrheumatic aortic valve stenosis   2. Status post coronary artery bypass graft   3. Benign hypertension   4. Mixed hyperlipidemia    PLAN:    In order of problems listed above:  1. Nonrheumatic aortic valve stenosis: Not critical we will continue monitoring. 2. Coronary artery disease status post coronary artery bypass graft with recent drug-eluting stent implantation to origin of graft going to obtuse marginal 3.  Doing well we will continue dual antiplatelet agents for at least 12 months.  I stressed importance of taking those medications.  We will review his cardiac catheterization and what was done. 3. Essential hypertension: Blood pressure well controlled we will continue present management. 4. Mixed dyslipidemia: The key will be to intensify his medications.  I will check his fasting lipid profile as well as liver profile today and based on that we will decide about medications.  He does have CLL therefore  I do not think PCS K9 will be an option for him.   Medication Adjustments/Labs and Tests Ordered: Current medicines are reviewed at length with the patient today.  Concerns regarding medicines are outlined above.  No orders of the defined types were placed in this encounter.  Medication changes: No orders of the defined types were placed in this encounter.   Signed, Park Liter, MD, [facility] 01/13/2018 8:43 AM    [place]

## 2018-01-13 NOTE — Patient Instructions (Signed)
Medication Instructions:  Your physician recommends that you continue on your current medications as directed. Please refer to the Current Medication list given to you today.   Labwork: Your physician recommends that you return for lab work today: lipid panel, hepatic function panel.   Testing/Procedures: None  Follow-Up: Your physician wants you to follow-up in: 3 months. You will receive a reminder letter in the mail two months in advance. If you don't receive a letter, please call our office to schedule the follow-up appointment.   Any Other Special Instructions Will Be Listed Below (If Applicable).     If you need a refill on your cardiac medications before your next appointment, please call your pharmacy.

## 2018-01-14 LAB — LIPID PANEL
CHOL/HDL RATIO: 2.6 ratio (ref 0.0–5.0)
Cholesterol, Total: 170 mg/dL (ref 100–199)
HDL: 66 mg/dL (ref >39)
LDL CALC: 79 mg/dL (ref 0–99)
Triglycerides: 123 mg/dL (ref 0–149)
VLDL Cholesterol Cal: 25 mg/dL (ref 5–40)

## 2018-01-14 LAB — HEPATIC FUNCTION PANEL
ALT: 12 IU/L (ref 0–44)
AST: 14 IU/L (ref 0–40)
Albumin: 4.4 g/dL (ref 3.5–4.8)
Alkaline Phosphatase: 77 IU/L (ref 39–117)
BILIRUBIN, DIRECT: 0.15 mg/dL (ref 0.00–0.40)
Bilirubin Total: 0.5 mg/dL (ref 0.0–1.2)
TOTAL PROTEIN: 6.2 g/dL (ref 6.0–8.5)

## 2018-01-15 ENCOUNTER — Encounter: Payer: Self-pay | Admitting: *Deleted

## 2018-01-29 DIAGNOSIS — I2510 Atherosclerotic heart disease of native coronary artery without angina pectoris: Secondary | ICD-10-CM | POA: Diagnosis not present

## 2018-01-29 DIAGNOSIS — Z79899 Other long term (current) drug therapy: Secondary | ICD-10-CM | POA: Diagnosis not present

## 2018-01-29 DIAGNOSIS — Z0000 Encounter for general adult medical examination without abnormal findings: Secondary | ICD-10-CM | POA: Diagnosis not present

## 2018-01-29 DIAGNOSIS — R739 Hyperglycemia, unspecified: Secondary | ICD-10-CM | POA: Diagnosis not present

## 2018-01-29 DIAGNOSIS — E785 Hyperlipidemia, unspecified: Secondary | ICD-10-CM | POA: Diagnosis not present

## 2018-01-29 DIAGNOSIS — E039 Hypothyroidism, unspecified: Secondary | ICD-10-CM | POA: Diagnosis not present

## 2018-01-29 DIAGNOSIS — C9190 Lymphoid leukemia, unspecified not having achieved remission: Secondary | ICD-10-CM | POA: Diagnosis not present

## 2018-02-14 DIAGNOSIS — Z1211 Encounter for screening for malignant neoplasm of colon: Secondary | ICD-10-CM | POA: Diagnosis not present

## 2018-05-08 DIAGNOSIS — D47Z9 Other specified neoplasms of uncertain behavior of lymphoid, hematopoietic and related tissue: Secondary | ICD-10-CM | POA: Diagnosis not present

## 2018-05-08 DIAGNOSIS — D509 Iron deficiency anemia, unspecified: Secondary | ICD-10-CM | POA: Diagnosis not present

## 2018-05-08 DIAGNOSIS — D479 Neoplasm of uncertain behavior of lymphoid, hematopoietic and related tissue, unspecified: Secondary | ICD-10-CM | POA: Diagnosis not present

## 2018-05-16 ENCOUNTER — Encounter: Payer: Self-pay | Admitting: Cardiology

## 2018-05-16 ENCOUNTER — Ambulatory Visit: Payer: Medicare Other | Admitting: Cardiology

## 2018-05-16 VITALS — BP 154/64 | HR 67 | Ht 67.0 in | Wt 201.8 lb

## 2018-05-16 DIAGNOSIS — E782 Mixed hyperlipidemia: Secondary | ICD-10-CM

## 2018-05-16 DIAGNOSIS — I350 Nonrheumatic aortic (valve) stenosis: Secondary | ICD-10-CM | POA: Diagnosis not present

## 2018-05-16 DIAGNOSIS — I10 Essential (primary) hypertension: Secondary | ICD-10-CM

## 2018-05-16 DIAGNOSIS — C9110 Chronic lymphocytic leukemia of B-cell type not having achieved remission: Secondary | ICD-10-CM

## 2018-05-16 DIAGNOSIS — C9190 Lymphoid leukemia, unspecified not having achieved remission: Secondary | ICD-10-CM

## 2018-05-16 DIAGNOSIS — I25110 Atherosclerotic heart disease of native coronary artery with unstable angina pectoris: Secondary | ICD-10-CM | POA: Diagnosis not present

## 2018-05-16 DIAGNOSIS — I739 Peripheral vascular disease, unspecified: Secondary | ICD-10-CM

## 2018-05-16 LAB — LIPID PANEL
CHOL/HDL RATIO: 2.7 ratio (ref 0.0–5.0)
Cholesterol, Total: 203 mg/dL — ABNORMAL HIGH (ref 100–199)
HDL: 76 mg/dL (ref >39)
LDL CALC: 107 mg/dL — AB (ref 0–99)
Triglycerides: 98 mg/dL (ref 0–149)
VLDL CHOLESTEROL CAL: 20 mg/dL (ref 5–40)

## 2018-05-16 NOTE — Progress Notes (Signed)
Cardiology Office Note:    Date:  05/16/2018   ID:  James Hammond, DOB [birth year], MRN [id number]  PCP:  Street, [name], MD  Cardiologist:  Jenne Campus, MD    Referring MD: Street, [name], *   Chief Complaint  Patient presents with  . Follow-up    4 months  Doing well  History of Present Illness:    James Hammond is a 77 y.o. male with history of coronary artery disease, status post PTCA and drug-eluting stent to graft going to obtuse marginal branch done in April of this year.  Also does have a cardiac arterial disease as well as mild aortic stenosis with gradient of 10 mmHg and cardiac catheterization from April comes today to office for follow-up doing well very active like always interestingly before last intervention on his heart which was in April he did not have typical symptoms.  The key right now is to modify his risk factors for coronary artery disease.  He does have chronic anemia as well as CLL that is being followed by oncology team.  Past Medical History:  Diagnosis Date  . Arthritis    "left hip, lower back" (01/01/2018)  . CLL (chronic lymphocytic leukemia) ([place])    "never had tx for it" (01/01/2018)  . Coronary artery disease   . Gallstone   . GERD (gastroesophageal reflux disease)   . Heart murmur    told recently that he has a slight murmur   . Hepatitis    as a child   . Hiatal hernia   . History of kidney stones   . Hyperlipidemia   . Hypertension   . Hypothyroidism    doesn't need treatment at [facility] s point   . Kidney cysts   . Urticaria     Past Surgical History:  Procedure Laterality Date  . APPENDECTOMY     77 y.o.  . BACK SURGERY    . CARDIAC CATHETERIZATION  06/2016   "couldn't get thru so they did bypass OR"  . CORONARY ARTERY BYPASS GRAFT  06/2016   CABG X4  . CORONARY STENT INTERVENTION N/A 01/01/2018   Procedure: CORONARY STENT INTERVENTION;  Surgeon: Wellington Hampshire, MD;  Location: [place] [facility];  Service:  Cardiovascular;  Laterality: N/A;  SVG to OM ostium  . INCISION AND DRAINAGE ABSCESS ANAL  1968  . LUMBAR LAMINECTOMY/DECOMPRESSION MICRODISCECTOMY Right 05/13/2015   Procedure: Microdiscectomy - Lumbar three--four  Lumbar four-five - right;  Surgeon: Kary Kos, MD;  Location: [place] [facility];  Service: Neurosurgery;  Laterality: Right;  . PILONIDAL CYST EXCISION  1965   required 30 day hosp. due to  (infection) being in the [employer] & [place]   . RIGHT/LEFT HEART CATH AND CORONARY/GRAFT ANGIOGRAPHY N/A 01/01/2018   Procedure: RIGHT/LEFT HEART CATH AND CORONARY/GRAFT ANGIOGRAPHY;  Surgeon: Wellington Hampshire, MD;  Location: [place] [facility];  Service: Cardiovascular;  Laterality: N/A;    Current Medications: Current Meds  Medication Sig  . aspirin 81 MG tablet Take 81 mg by mouth daily.  . cetirizine (ZYRTEC) 10 MG tablet Take 1 tablet daily at bedtime  . clopidogrel (PLAVIX) 75 MG tablet Take 1 tablet (75 mg total) by mouth daily with breakfast.  . EPINEPHrine 0.3 mg/0.3 mL IJ SOAJ injection AS NEEDED FOR SEVERE ALLERGIC REACTIONS  . famotidine (PEPCID) 10 MG tablet Take 10 mg by mouth 2 (two) times daily.  . Ferrous Sulfate (IRON) 325 (65 Fe) MG TABS Take  1 tablet by mouth 3 (three) times daily.  Marland Kitchen levothyroxine (SYNTHROID, LEVOTHROID) 25 MCG tablet Take 25 mcg by mouth See admin instructions. 1 Daily except 2 on Thursday and Sunday  . metoprolol succinate (TOPROL-XL) 25 MG 24 hr tablet Take 25 mg by mouth daily.  . nitroGLYCERIN (NITROSTAT) 0.4 MG SL tablet Place 1 tablet (0.4 mg total) under the tongue every 5 (five) minutes as needed.  . pravastatin (PRAVACHOL) 20 MG tablet Take 1 tablet (20 mg total) by mouth every evening.     Allergies:   Protamine   Social History   Socioeconomic History  . Marital status: Married    Spouse name: Not on file  . Number of children: Not on file  . Years of education: Not on file  . Highest education level: Not on file    Occupational History  . Not on file  Social Needs  . Financial resource strain: Not on file  . Food insecurity:    Worry: Not on file    Inability: Not on file  . Transportation needs:    Medical: Not on file    Non-medical: Not on file  Tobacco Use  . Smoking status: Former Smoker    Packs/day: 1.00    Years: 25.00    Pack years: 25.00    Types: Cigarettes    Last attempt to quit: 05/09/1983    Years since quitting: 35.0  . Smokeless tobacco: Never Used  Substance and Sexual Activity  . Alcohol use: No  . Drug use: No  . Sexual activity: Yes  Lifestyle  . Physical activity:    Days per week: Not on file    Minutes per session: Not on file  . Stress: Not on file  Relationships  . Social connections:    Talks on phone: Not on file    Gets together: Not on file    Attends religious service: Not on file    Active member of club or organization: Not on file    Attends meetings of clubs or organizations: Not on file    Relationship status: Not on file  Other Topics Concern  . Not on file  Social History Narrative  . Not on file     Family History: The patient's family history includes Cancer in his sister; Heart attack in his mother; Prostate cancer in his brother; Stroke in his brother and father. There is no history of Allergic rhinitis, Angioedema, Asthma, Atopy, Eczema, or Immunodeficiency. ROS:   Please see the history of present illness.    All 14 point review of systems negative except as described per history of present illness  EKGs/Labs/Other Studies Reviewed:      Recent Labs: 01/02/2018: BUN 12; Creatinine, Ser 1.23; Hemoglobin 12.4; Platelets 182; Potassium 3.9; Sodium 139 01/13/2018: ALT 12  Recent Lipid Panel    Component Value Date/Time   CHOL 170 01/13/2018 0909   TRIG 123 01/13/2018 0909   HDL 66 01/13/2018 0909   CHOLHDL 2.6 01/13/2018 0909   LDLCALC 79 01/13/2018 0909    Physical Exam:    VS:  BP (!) 154/64 (BP Location: Right Arm,  Patient Position: Sitting, Cuff Size: Normal)   Pulse 67   Ht 5\' 7"  (1.702 m)   Wt 201 lb 12.8 oz (91.5 kg)   SpO2 98%   BMI 31.61 kg/m     Wt Readings from Last 3 Encounters:  05/16/18 201 lb 12.8 oz (91.5 kg)  01/13/18 197 lb 1.9 oz (89.4 kg)  01/02/18 200 lb 6.4 oz (90.9 kg)     GEN:  Well nourished, well developed in no acute distress HEENT: Normal NECK: No JVD; bilateral bruit LYMPHATICS: No lymphadenopathy CARDIAC: RRR, systolic ejection murmur grade 2/6 best heard right upper portion of the sternum with radiation towards the neck, no rubs, no gallops RESPIRATORY:  Clear to auscultation without rales, wheezing or rhonchi  ABDOMEN: Soft, non-tender, non-distended MUSCULOSKELETAL:  No edema; No deformity  SKIN: Warm and dry LOWER EXTREMITIES: no swelling NEUROLOGIC:  Alert and oriented x 3 PSYCHIATRIC:  Normal affect   ASSESSMENT:    1. PVD (peripheral vascular disease) ([place])   2. Coronary artery disease involving native coronary artery of native heart with unstable angina pectoris ([place])   3. Nonrheumatic aortic valve stenosis   4. Benign hypertension   5. CLL (chronic lymphocytic leukemia) ([place])   6. Mixed hyperlipidemia    PLAN:    In order of problems listed above:  1. Coronary artery disease: Doing well from that point review on dual antiplatelet therapy which I will continue for now.  Probably next year in April will be year anniversary of his stent implantation will be able to discontinue Plavix but then we may consider Xarelto 2.5 twice daily.  He is inquiring about potential colonoscopy from my standpoint review I will wait for 6 months after stent implantation before discontinuation or interruption of dual antiplatelet therapy. 2. Aortic stenosis only mild gradient is 10 mmHg on cardiac cath from April. 3. Peripheral vascular disease with history of carotid arterial disease carotid ultrasounds will be done he does have a bruit on both sides 4. CLL followed by  internal medicine team.  As well as oncology. 5. Mixed dyslipidemia we will recheck his fasting lipid profile his LDL must be below 70.   Medication Adjustments/Labs and Tests Ordered: Current medicines are reviewed at length with the patient today.  Concerns regarding medicines are outlined above.  No orders of the defined types were placed in this encounter.  Medication changes: No orders of the defined types were placed in this encounter.   Signed, Park Liter, MD, [facility] 05/16/2018 8:39 AM    [place]

## 2018-05-16 NOTE — Patient Instructions (Signed)
Medication Instructions:  Your physician recommends that you continue on your current medications as directed. Please refer to the Current Medication list given to you today.  Labwork: Your physician recommends that you have the following labs drawn: lipid panel  Testing/Procedures: Your physician has requested that you have a carotid duplex. This test is an ultrasound of the carotid arteries in your neck. It looks at blood flow through these arteries that supply the brain with blood. Allow one hour for this exam. There are no restrictions or special instructions.  Follow-Up: Your physician recommends that you schedule a follow-up appointment in: 5 months  Any Other Special Instructions Will Be Listed Below (If Applicable).     If you need a refill on your cardiac medications before your next appointment, please call your pharmacy.   [place], RN, BSN

## 2018-05-26 ENCOUNTER — Other Ambulatory Visit: Payer: Self-pay

## 2018-05-26 DIAGNOSIS — I25110 Atherosclerotic heart disease of native coronary artery with unstable angina pectoris: Secondary | ICD-10-CM

## 2018-05-26 DIAGNOSIS — E782 Mixed hyperlipidemia: Secondary | ICD-10-CM

## 2018-05-26 MED ORDER — PRAVASTATIN SODIUM 40 MG PO TABS: 40.0000 mg | ORAL_TABLET | Freq: Every evening | ORAL | 2 refills | Status: DC

## 2018-06-26 DIAGNOSIS — Z79899 Other long term (current) drug therapy: Secondary | ICD-10-CM | POA: Diagnosis not present

## 2018-06-26 DIAGNOSIS — E785 Hyperlipidemia, unspecified: Secondary | ICD-10-CM | POA: Diagnosis not present

## 2018-06-26 DIAGNOSIS — I10 Essential (primary) hypertension: Secondary | ICD-10-CM | POA: Diagnosis not present

## 2018-06-26 DIAGNOSIS — E039 Hypothyroidism, unspecified: Secondary | ICD-10-CM | POA: Diagnosis not present

## 2018-06-26 DIAGNOSIS — I2510 Atherosclerotic heart disease of native coronary artery without angina pectoris: Secondary | ICD-10-CM | POA: Diagnosis not present

## 2018-07-03 ENCOUNTER — Ambulatory Visit (INDEPENDENT_AMBULATORY_CARE_PROVIDER_SITE_OTHER): Payer: Medicare Other

## 2018-07-03 DIAGNOSIS — I739 Peripheral vascular disease, unspecified: Secondary | ICD-10-CM

## 2018-07-03 NOTE — Progress Notes (Signed)
Carotid duplex exam has been performed. Bilateral ICA stenosis was seen and evaluated.  Jimmy Shelley Cocke RDCS, RVT 

## 2018-07-08 ENCOUNTER — Telehealth: Payer: Self-pay | Admitting: Cardiology

## 2018-07-08 NOTE — Telephone Encounter (Signed)
Attempted to call patient back. NO answer, no option to leave voicemail. Will continue efforts.

## 2018-07-08 NOTE — Telephone Encounter (Signed)
Wants someone to call him to go over his vascular results again-and wants to know "if the doctor puts his schedule ahead of my life"

## 2018-07-09 NOTE — Telephone Encounter (Signed)
Patient is ery anxious and would like to see Dr [place] Desanctis call him.

## 2018-07-09 NOTE — Telephone Encounter (Signed)
Informed patient of carotid ultrasound results again and confirmed with Dr.Krasowski that sooner appointment is not needed. Will see patient on 07/17/18. No further questions

## 2018-07-17 ENCOUNTER — Encounter: Payer: Self-pay | Admitting: Cardiology

## 2018-07-17 ENCOUNTER — Ambulatory Visit: Payer: Medicare Other | Admitting: Cardiology

## 2018-07-17 VITALS — BP 140/60 | HR 69 | Ht 67.0 in | Wt 202.4 lb

## 2018-07-17 DIAGNOSIS — I25110 Atherosclerotic heart disease of native coronary artery with unstable angina pectoris: Secondary | ICD-10-CM

## 2018-07-17 DIAGNOSIS — I350 Nonrheumatic aortic (valve) stenosis: Secondary | ICD-10-CM

## 2018-07-17 DIAGNOSIS — I10 Essential (primary) hypertension: Secondary | ICD-10-CM | POA: Diagnosis not present

## 2018-07-17 DIAGNOSIS — Z951 Presence of aortocoronary bypass graft: Secondary | ICD-10-CM

## 2018-07-17 MED ORDER — EZETIMIBE 10 MG PO TABS: 10.0000 mg | ORAL_TABLET | Freq: Every day | ORAL | 3 refills | Status: DC

## 2018-07-17 NOTE — Progress Notes (Signed)
Cardiology Office Note:    Date:  07/17/2018   ID:  NESHAWN Hammond, DOB 06/27/1941, MRN [id number]  PCP:  Street, James Mt, MD  Cardiologist:  Jenne Campus, MD    Referring MD: Street, James Hammond, *   Chief Complaint  Patient presents with  . Follow up on Carotid  Doing well  History of Present Illness:    James Hammond is a 77 y.o. male with coronary artery disease status post PTCA and stenting in April 2018.  On dual antiplatelet therapy which I will continue he also had carotid ultrasound done which showed up to 80% stenosis.  Range between 69-79.  He did not have any TIA or CVA symptoms recently.  The key will be to modify risk factors for coronary artery disease, therefore I will ask him to continue with dual antiplatelet agents as well as will add Zetia to his medical regimen.  He is asymptomatic no chest pain tightness squeezing pressure burning chest  Past Medical History:  Diagnosis Date  . Arthritis    "left hip, lower back" (01/01/2018)  . CLL (chronic lymphocytic leukemia) ([place])    "never had tx for it" (01/01/2018)  . Coronary artery disease   . Gallstone   . GERD (gastroesophageal reflux disease)   . Heart murmur    told recently that he has a slight murmur   . Hepatitis    as a child   . Hiatal hernia   . History of kidney stones   . Hyperlipidemia   . Hypertension   . Hypothyroidism    doesn't need treatment at [facility] s point   . Kidney cysts   . Urticaria     Past Surgical History:  Procedure Laterality Date  . APPENDECTOMY     77 y.o.  . BACK SURGERY    . CARDIAC CATHETERIZATION  06/2016   "couldn't get thru so they did bypass OR"  . CORONARY ARTERY BYPASS GRAFT  06/2016   CABG X4  . CORONARY STENT INTERVENTION N/A 01/01/2018   Procedure: CORONARY STENT INTERVENTION;  Surgeon: James Hampshire, MD;  Location: [place] [facility];  Service: Cardiovascular;  Laterality: N/A;  SVG to OM ostium  . INCISION AND DRAINAGE ABSCESS ANAL  1968  .  LUMBAR LAMINECTOMY/DECOMPRESSION MICRODISCECTOMY Right 05/13/2015   Procedure: Microdiscectomy - Lumbar three--four  Lumbar four-five - right;  Surgeon: James Kos, MD;  Location: [place] [facility];  Service: Neurosurgery;  Laterality: Right;  . PILONIDAL CYST EXCISION  1965   required 30 day hosp. due to  (infection) being in the [employer] & [place] the [place]   . RIGHT/LEFT HEART CATH AND CORONARY/GRAFT ANGIOGRAPHY N/A 01/01/2018   Procedure: RIGHT/LEFT HEART CATH AND CORONARY/GRAFT ANGIOGRAPHY;  Surgeon: James Hampshire, MD;  Location: [place] [facility];  Service: Cardiovascular;  Laterality: N/A;    Current Medications: Current Meds  Medication Sig  . aspirin 81 MG tablet Take 81 mg by mouth daily.  . cetirizine (ZYRTEC) 10 MG tablet Take 1 tablet daily at bedtime  . clopidogrel (PLAVIX) 75 MG tablet Take 1 tablet (75 mg total) by mouth daily with breakfast.  . EPINEPHrine 0.3 mg/0.3 mL IJ SOAJ injection AS NEEDED FOR SEVERE ALLERGIC REACTIONS  . famotidine (PEPCID) 10 MG tablet Take 10 mg by mouth 2 (two) times daily.  . Ferrous Sulfate (IRON) 325 (65 Fe) MG TABS Take 1 tablet by mouth 3 (three) times daily.  Marland Kitchen levothyroxine (SYNTHROID, LEVOTHROID) 25 MCG tablet Take 25  mcg by mouth See admin instructions. 1 Daily except 2 on Thursday and Sunday  . metoprolol succinate (TOPROL-XL) 25 MG 24 hr tablet Take 25 mg by mouth daily.  . nitroGLYCERIN (NITROSTAT) 0.4 MG SL tablet Place 1 tablet (0.4 mg total) under the tongue every 5 (five) minutes as needed.  . pravastatin (PRAVACHOL) 40 MG tablet Take 1 tablet (40 mg total) by mouth every evening.     Allergies:   Protamine   Social History   Socioeconomic History  . Marital status: Married    Spouse name: Not on file  . Number of children: Not on file  . Years of education: Not on file  . Highest education level: Not on file  Occupational History  . Not on file  Social Needs  . Financial resource strain: Not on file  . Food  insecurity:    Worry: Not on file    Inability: Not on file  . Transportation needs:    Medical: Not on file    Non-medical: Not on file  Tobacco Use  . Smoking status: Former Smoker    Packs/day: 1.00    Years: 25.00    Pack years: 25.00    Types: Cigarettes    Last attempt to quit: 05/09/1983    Years since quitting: 35.2  . Smokeless tobacco: Never Used  Substance and Sexual Activity  . Alcohol use: No  . Drug use: No  . Sexual activity: Yes  Lifestyle  . Physical activity:    Days per week: Not on file    Minutes per session: Not on file  . Stress: Not on file  Relationships  . Social connections:    Talks on phone: Not on file    Gets together: Not on file    Attends religious service: Not on file    Active member of club or organization: Not on file    Attends meetings of clubs or organizations: Not on file    Relationship status: Not on file  Other Topics Concern  . Not on file  Social History Narrative  . Not on file     Family History: The patient's family history includes Cancer in his sister; Heart attack in his mother; Prostate cancer in his brother; Stroke in his brother and father. There is no history of Allergic rhinitis, Angioedema, Asthma, Atopy, Eczema, or Immunodeficiency. ROS:   Please see the history of present illness.    All 14 point review of systems negative except as described per history of present illness  EKGs/Labs/Other Studies Reviewed:      Recent Labs: 01/02/2018: BUN 12; Creatinine, Ser 1.23; Hemoglobin 12.4; Platelets 182; Potassium 3.9; Sodium 139 01/13/2018: ALT 12  Recent Lipid Panel    Component Value Date/Time   CHOL 203 (H) 05/16/2018 0854   TRIG 98 05/16/2018 0854   HDL 76 05/16/2018 0854   CHOLHDL 2.7 05/16/2018 0854   LDLCALC 107 (H) 05/16/2018 0854    Physical Exam:    VS:  BP 140/60   Pulse 69   Ht 5\' 7"  (1.702 m)   Wt 202 lb 6.4 oz (91.8 kg)   SpO2 97%   BMI 31.70 kg/m     Wt Readings from Last 3  Encounters:  07/17/18 202 lb 6.4 oz (91.8 kg)  05/16/18 201 lb 12.8 oz (91.5 kg)  01/13/18 197 lb 1.9 oz (89.4 kg)     GEN:  Well nourished, well developed in no acute distress HEENT: Normal NECK: No JVD; No  carotid bruits LYMPHATICS: No lymphadenopathy CARDIAC: RRR, systolic ejection murmur grade 2/6 to 3/6 best heard at the right upper portion of the sternum, no rubs, no gallops RESPIRATORY:  Clear to auscultation without rales, wheezing or rhonchi  ABDOMEN: Soft, non-tender, non-distended MUSCULOSKELETAL:  No edema; No deformity  SKIN: Warm and dry LOWER EXTREMITIES: no swelling NEUROLOGIC:  Alert and oriented x 3 PSYCHIATRIC:  Normal affect   ASSESSMENT:    1. Nonrheumatic aortic valve stenosis   2. Benign hypertension   3. Coronary artery disease involving native coronary artery of native heart with unstable angina pectoris ([place])   4. Status post coronary artery bypass graft    PLAN:    In order of problems listed above:  1. Nonrheumatic aortic valve stenosis only mild with peak creatinine of 22.  We will continue monitoring. 2. Coronary artery disease asymptomatic just returned from fishing trip doing well 3. Essential hypertension blood pressure well controlled continue present management 4. Dyslipidemia we will add Zetia to his medical regiment 5. Status post coronary artery bypass graft.  Noted   Medication Adjustments/Labs and Tests Ordered: Current medicines are reviewed at length with the patient today.  Concerns regarding medicines are outlined above.  No orders of the defined types were placed in this encounter.  Medication changes: No orders of the defined types were placed in this encounter.   Signed, Park Liter, MD, [facility] 07/17/2018 2:08 PM    [facility]

## 2018-07-17 NOTE — Patient Instructions (Signed)
Medication Instructions:  Your physician has recommended you make the following change in your medication:   START: Zetia 10 mg daily  If you need a refill on your cardiac medications before your next appointment, please call your pharmacy.   Lab work: None.   If you have labs (blood work) drawn today and your tests are completely normal, you will receive your results only by: Marland Kitchen MyChart Message (if you have MyChart) OR . A paper copy in the mail If you have any lab test that is abnormal or we need to change your treatment, we will call you to review the results.  Testing/Procedures: None.  Follow-Up: At [facility], you and your health needs are our priority.  As part of our continuing mission to provide you with exceptional heart care, we have created designated Provider Care Teams.  These Care Teams include your primary Cardiologist (physician) and Advanced Practice Providers (APPs -  Physician Assistants and Nurse Practitioners) who all work together to provide you with the care you need, when you need it. You will need a follow up appointment in 3 months.  Please call our office 2 months in advance to schedule this appointment.  You may see No primary care provider on file. or another member of our [employer] Provider Team in [place]: Shirlee More, MD . Jyl Heinz, MD  Any Other Special Instructions Will Be Listed Below (If Applicable).  Ezetimibe Tablets What is this medicine? EZETIMIBE (ez ET i mibe) blocks the absorption of cholesterol from the stomach. It can help lower blood cholesterol for patients who are at risk of getting heart disease or a stroke. It is only for patients whose cholesterol level is not controlled by diet. This medicine may be used for other purposes; ask your health care provider or pharmacist if you have questions. COMMON BRAND NAME(S): Zetia What should I tell my health care provider before I take this medicine? They need to know if you have  any of these conditions: -liver disease -an unusual or allergic reaction to ezetimibe, medicines, foods, dyes, or preservatives -pregnant or trying to get pregnant -breast-feeding How should I use this medicine? Take this medicine by mouth with a glass of water. Follow the directions on the prescription label. This medicine can be taken with or without food. Take your doses at regular intervals. Do not take your medicine more often than directed. Talk to your pediatrician regarding the use of this medicine in children. Special care may be needed. Overdosage: If you think you have taken too much of this medicine contact a poison control center or emergency room at once. NOTE: This medicine is only for you. Do not share this medicine with others. What if I miss a dose? If you miss a dose, take it as soon as you can. If it is almost time for your next dose, take only that dose. Do not take double or extra doses. What may interact with this medicine? Do not take this medicine with any of the following medications: -fenofibrate -gemfibrozil This medicine may also interact with the following medications: -antacids -cyclosporine -herbal medicines like red yeast rice -other medicines to lower cholesterol or triglycerides This list may not describe all possible interactions. Give your health care provider a list of all the medicines, herbs, non-prescription drugs, or dietary supplements you use. Also tell them if you smoke, drink alcohol, or use illegal drugs. Some items may interact with your medicine. What should I watch for while using this medicine?  Visit your doctor or health care professional for regular checks on your progress. You will need to have your cholesterol levels checked. If you are also taking some other cholesterol medicines, you will also need to have tests to make sure your liver is working properly. Tell your doctor or health care professional if you get any unexplained muscle  pain, tenderness, or weakness, especially if you also have a fever and tiredness. You need to follow a low-cholesterol, low-fat diet while you are taking this medicine. This will decrease your risk of getting heart and blood vessel disease. Exercising and avoiding alcohol and smoking can also help. Ask your doctor or dietician for advice. What side effects may I notice from receiving this medicine? Side effects that you should report to your doctor or health care professional as soon as possible: -allergic reactions like skin rash, itching or hives, swelling of the face, lips, or tongue -dark yellow or brown urine -unusually weak or tired -yellowing of the skin or eyes Side effects that usually do not require medical attention (report to your doctor or health care professional if they continue or are bothersome): -diarrhea -dizziness -headache -stomach upset or pain This list may not describe all possible side effects. Call your doctor for medical advice about side effects. You may report side effects to FDA at 1-800-FDA-1088. Where should I keep my medicine? Keep out of the reach of children. Store at room temperature between 15 and 30 degrees C (59 and 86 degrees F). Protect from moisture. Keep container tightly closed. Throw away any unused medicine after the expiration date. NOTE: This sheet is a summary. It may not cover all possible information. If you have questions about this medicine, talk to your doctor, pharmacist, or health care provider.  2018 Elsevier/Gold Standard (2012-03-24 15:39:09)

## 2018-08-19 DIAGNOSIS — L918 Other hypertrophic disorders of the skin: Secondary | ICD-10-CM | POA: Diagnosis not present

## 2018-08-19 DIAGNOSIS — L821 Other seborrheic keratosis: Secondary | ICD-10-CM | POA: Diagnosis not present

## 2018-08-19 DIAGNOSIS — L570 Actinic keratosis: Secondary | ICD-10-CM | POA: Diagnosis not present

## 2018-09-23 ENCOUNTER — Telehealth: Payer: Self-pay | Admitting: Cardiology

## 2018-09-23 MED ORDER — CLOPIDOGREL BISULFATE 75 MG PO TABS: 75.0000 mg | ORAL_TABLET | Freq: Every day | ORAL | 1 refills | Status: DC

## 2018-09-23 NOTE — Telephone Encounter (Signed)
°*  STAT* If patient is at the pharmacy, call can be transferred to refill team.   1. Which medications need to be refilled? (please list name of each medication and dose if known) Clopidigrel 75 mg takes 1 daily   2. Which pharmacy/location (including street and city if local pharmacy) is medication to be sent to? [facility] in [place]   3. Do they need a 30 day or 90 day supply? [place]

## 2018-09-23 NOTE — Telephone Encounter (Signed)
Medication refilled

## 2018-10-20 ENCOUNTER — Ambulatory Visit: Payer: Medicare Other | Admitting: Cardiology

## 2018-11-07 DIAGNOSIS — D479 Neoplasm of uncertain behavior of lymphoid, hematopoietic and related tissue, unspecified: Secondary | ICD-10-CM | POA: Diagnosis not present

## 2018-11-07 DIAGNOSIS — C9110 Chronic lymphocytic leukemia of B-cell type not having achieved remission: Secondary | ICD-10-CM | POA: Diagnosis not present

## 2018-11-07 DIAGNOSIS — D509 Iron deficiency anemia, unspecified: Secondary | ICD-10-CM | POA: Diagnosis not present

## 2018-11-12 ENCOUNTER — Ambulatory Visit (INDEPENDENT_AMBULATORY_CARE_PROVIDER_SITE_OTHER): Payer: Medicare Other | Admitting: Cardiology

## 2018-11-12 ENCOUNTER — Encounter: Payer: Self-pay | Admitting: Cardiology

## 2018-11-12 VITALS — BP 142/62 | HR 60 | Ht 67.0 in | Wt 200.6 lb

## 2018-11-12 DIAGNOSIS — Z951 Presence of aortocoronary bypass graft: Secondary | ICD-10-CM | POA: Diagnosis not present

## 2018-11-12 DIAGNOSIS — I25110 Atherosclerotic heart disease of native coronary artery with unstable angina pectoris: Secondary | ICD-10-CM

## 2018-11-12 DIAGNOSIS — I350 Nonrheumatic aortic (valve) stenosis: Secondary | ICD-10-CM

## 2018-11-12 DIAGNOSIS — I739 Peripheral vascular disease, unspecified: Secondary | ICD-10-CM | POA: Diagnosis not present

## 2018-11-12 LAB — HEPATIC FUNCTION PANEL
ALT: 17 IU/L (ref 0–44)
AST: 17 IU/L (ref 0–40)
Albumin: 4.3 g/dL (ref 3.7–4.7)
Alkaline Phosphatase: 71 IU/L (ref 39–117)
BILIRUBIN TOTAL: 0.9 mg/dL (ref 0.0–1.2)
BILIRUBIN, DIRECT: 0.22 mg/dL (ref 0.00–0.40)
Total Protein: 6.2 g/dL (ref 6.0–8.5)

## 2018-11-12 LAB — LIPID PANEL
Chol/HDL Ratio: 2.4 ratio (ref 0.0–5.0)
Cholesterol, Total: 150 mg/dL (ref 100–199)
HDL: 62 mg/dL (ref >39)
LDL Calculated: 61 mg/dL (ref 0–99)
Triglycerides: 133 mg/dL (ref 0–149)
VLDL CHOLESTEROL CAL: 27 mg/dL (ref 5–40)

## 2018-11-12 NOTE — Progress Notes (Signed)
Cardiology Office Note:    Date:  11/12/2018   ID:  James FAIRBANK, DOB 1941/03/16, MRN [id number]  PCP:  Street, [name], MD  Cardiologist:  Jenne Campus, MD    Referring MD: Street, [name], *   Chief Complaint  Patient presents with  . Follow-up  Doing well  History of Present Illness:    James Hammond is a 78 y.o. male with coronary artery disease status post coronary artery bypass graft, status post PTCA and angioplasty with drug-eluting stent to SVG going to obtuse marginal 3 done in April 2019.  Also aortic stenosis last assessed in April of last year.  Comes today to my office for follow-up doing well denies have any chest pain tightness squeezing pressure burning chest he is very active have no difficulty being that way.  Busy taking care of his garden he is getting ready for new growing season.  Past Medical History:  Diagnosis Date  . Arthritis    "left hip, lower back" (01/01/2018)  . CLL (chronic lymphocytic leukemia) ([place])    "never had tx for it" (01/01/2018)  . Coronary artery disease   . Gallstone   . GERD (gastroesophageal reflux disease)   . Heart murmur    told recently that he has a slight murmur   . Hepatitis    as a child   . Hiatal hernia   . History of kidney stones   . Hyperlipidemia   . Hypertension   . Hypothyroidism    doesn't need treatment at [facility]   . Kidney cysts   . Urticaria     Past Surgical History:  Procedure Laterality Date  . APPENDECTOMY     78 y.o.  . BACK SURGERY    . CARDIAC CATHETERIZATION  06/2016   "couldn't get thru so they did bypass OR"  . CORONARY ARTERY BYPASS GRAFT  06/2016   CABG X4  . CORONARY STENT INTERVENTION N/A 01/01/2018   Procedure: CORONARY STENT INTERVENTION;  Surgeon: Wellington Hampshire, MD;  Location: [place] [facility];  Service: Cardiovascular;  Laterality: N/A;  SVG to OM ostium  . INCISION AND DRAINAGE ABSCESS ANAL  1968  . LUMBAR LAMINECTOMY/DECOMPRESSION MICRODISCECTOMY  Right 05/13/2015   Procedure: Microdiscectomy - Lumbar three--four  Lumbar four-five - right;  Surgeon: Kary Kos, MD;  Location: [place] [facility];  Service: Neurosurgery;  Laterality: Right;  . PILONIDAL CYST EXCISION  1965   required 30 day hosp. due to  (infection) being in the [employer] & [place]   . RIGHT/LEFT HEART CATH AND CORONARY/GRAFT ANGIOGRAPHY N/A 01/01/2018   Procedure: RIGHT/LEFT HEART CATH AND CORONARY/GRAFT ANGIOGRAPHY;  Surgeon: Wellington Hampshire, MD;  Location: [place] [facility];  Service: Cardiovascular;  Laterality: N/A;    Current Medications: Current Meds  Medication Sig  . aspirin 81 MG tablet Take 81 mg by mouth daily.  . cetirizine (ZYRTEC) 10 MG tablet Take 1 tablet daily at bedtime  . clopidogrel (PLAVIX) 75 MG tablet Take 1 tablet (75 mg total) by mouth daily with breakfast.  . EPINEPHrine 0.3 mg/0.3 mL IJ SOAJ injection AS NEEDED FOR SEVERE ALLERGIC REACTIONS  . ezetimibe (ZETIA) 10 MG tablet Take 1 tablet (10 mg total) by mouth daily.  . famotidine (PEPCID) 10 MG tablet Take 10 mg by mouth 2 (two) times daily.  Marland Kitchen levothyroxine (SYNTHROID, LEVOTHROID) 25 MCG tablet Take 25 mcg by mouth See admin instructions. 1 Daily except 2 on Thursday and Sunday  .  metoprolol succinate (TOPROL-XL) 25 MG 24 hr tablet Take 25 mg by mouth daily.  . nitroGLYCERIN (NITROSTAT) 0.4 MG SL tablet Place 1 tablet (0.4 mg total) under the tongue every 5 (five) minutes as needed.  . pravastatin (PRAVACHOL) 40 MG tablet Take 1 tablet (40 mg total) by mouth every evening.     Allergies:   Protamine   Social History   Socioeconomic History  . Marital status: Married    Spouse name: Not on file  . Number of children: Not on file  . Years of education: Not on file  . Highest education level: Not on file  Occupational History  . Not on file  Social Needs  . Financial resource strain: Not on file  . Food insecurity:    Worry: Not on file    Inability: Not on file  .  Transportation needs:    Medical: Not on file    Non-medical: Not on file  Tobacco Use  . Smoking status: Former Smoker    Packs/day: 1.00    Years: 25.00    Pack years: 25.00    Types: Cigarettes    Last attempt to quit: 05/09/1983    Years since quitting: 35.5  . Smokeless tobacco: Never Used  Substance and Sexual Activity  . Alcohol use: No  . Drug use: No  . Sexual activity: Yes  Lifestyle  . Physical activity:    Days per week: Not on file    Minutes per session: Not on file  . Stress: Not on file  Relationships  . Social connections:    Talks on phone: Not on file    Gets together: Not on file    Attends religious service: Not on file    Active member of club or organization: Not on file    Attends meetings of clubs or organizations: Not on file    Relationship status: Not on file  Other Topics Concern  . Not on file  Social History Narrative  . Not on file     Family History: The patient's family history includes Cancer in his sister; Heart attack in his mother; Prostate cancer in his brother; Stroke in his brother and father. There is no history of Allergic rhinitis, Angioedema, Asthma, Atopy, Eczema, or Immunodeficiency. ROS:   Please see the history of present illness.    All 14 point review of systems negative except as described per history of present illness  EKGs/Labs/Other Studies Reviewed:      Recent Labs: 01/02/2018: BUN 12; Creatinine, Ser 1.23; Hemoglobin 12.4; Platelets 182; Potassium 3.9; Sodium 139 01/13/2018: ALT 12  Recent Lipid Panel    Component Value Date/Time   CHOL 203 (H) 05/16/2018 0854   TRIG 98 05/16/2018 0854   HDL 76 05/16/2018 0854   CHOLHDL 2.7 05/16/2018 0854   LDLCALC 107 (H) 05/16/2018 0854    Physical Exam:    VS:  BP (!) 142/62   Pulse 60   Ht 5\' 7"  (1.702 m)   Wt 200 lb 9.6 oz (91 kg)   SpO2 98%   BMI 31.42 kg/m     Wt Readings from Last 3 Encounters:  11/12/18 200 lb 9.6 oz (91 kg)  07/17/18 202 lb 6.4 oz  (91.8 kg)  05/16/18 201 lb 12.8 oz (91.5 kg)     GEN:  Well nourished, well developed in no acute distress HEENT: Normal NECK: No JVD; cardiac bruit heard on the left side. LYMPHATICS: No lymphadenopathy CARDIAC: RRR, systolic ejection murmur grade  2/6 to 3/6 best heard at the right upper portion of the sternum., no rubs, no gallops RESPIRATORY:  Clear to auscultation without rales, wheezing or rhonchi  ABDOMEN: Soft, non-tender, non-distended MUSCULOSKELETAL:  No edema; No deformity  SKIN: Warm and dry LOWER EXTREMITIES: no swelling NEUROLOGIC:  Alert and oriented x 3 PSYCHIATRIC:  Normal affect   ASSESSMENT:    1. Coronary artery disease involving native coronary artery of native heart with unstable angina pectoris ([place])   2. PVD (peripheral vascular disease) ([place])   3. Status post coronary artery bypass graft   4. Nonrheumatic aortic valve stenosis    PLAN:    In order of problems listed above:  1. Coronary artery disease status post PTCA and stenting done to SVG going to obtuse marginal 3 in April of last year we will continue dual antiplatelet therapy.  He is asymptomatic. 2. PVD I will schedule him to have cardiac ultrasounds which will be done in April of this year. 3. Status post coronary artery bypass graft noted stable. 4. Nonrheumatic aortic stenosis.  I will schedule him to have echocardiogram which will be done in April. 5. Dyslipidemia fasting lipid profile will be checked today.   Medication Adjustments/Labs and Tests Ordered: Current medicines are reviewed at length with the patient today.  Concerns regarding medicines are outlined above.  No orders of the defined types were placed in this encounter.  Medication changes: No orders of the defined types were placed in this encounter.   Signed, Park Liter, MD, [facility] 11/12/2018 8:20 AM    [place]

## 2018-11-12 NOTE — Patient Instructions (Signed)
Medication Instructions:  Your physician recommends that you continue on your current medications as directed. Please refer to the Current Medication list given to you today.  If you need a refill on your cardiac medications before your next appointment, please call your pharmacy.   Lab work: Your physician recommends that you return for lab work today: lft, and lipids  If you have labs (blood work) drawn today and your tests are completely normal, you will receive your results only by: Marland Kitchen MyChart Message (if you have MyChart) OR . A paper copy in the mail If you have any lab test that is abnormal or we need to change your treatment, we will call you to review the results.  Testing/Procedures: Your physician has requested that you have an echocardiogram. Echocardiography is a painless test that uses sound waves to create images of your heart. It provides your doctor with information about the size and shape of your heart and how well your heart's chambers and valves are working. This procedure takes approximately one hour. There are no restrictions for this procedure.  Your physician has requested that you have a carotid duplex. This test is an ultrasound of the carotid arteries in your neck. It looks at blood flow through these arteries that supply the brain with blood. Allow one hour for this exam. There are no restrictions or special instructions.    Follow-Up: At [facility], you and your health needs are our priority.  As part of our continuing mission to provide you with exceptional heart care, we have created designated Provider Care Teams.  These Care Teams include your primary Cardiologist (physician) and Advanced Practice Providers (APPs -  Physician Assistants and Nurse Practitioners) who all work together to provide you with the care you need, when you need it. You will need a follow up appointment in 6 months.  Please call our office 2 months in advance to schedule this  appointment.  You may see No primary care provider on file. or another member of our [employer] Provider Team in [place]: Shirlee More, MD . Jyl Heinz, MD  Any Other Special Instructions Will Be Listed Below (If Applicable).   Echocardiogram An echocardiogram is a procedure that uses painless sound waves (ultrasound) to produce an image of the heart. Images from an echocardiogram can provide important information about:  Signs of coronary artery disease (CAD).  Aneurysm detection. An aneurysm is a weak or damaged part of an artery wall that bulges out from the normal force of blood pumping through the body.  Heart size and shape. Changes in the size or shape of the heart can be associated with certain conditions, including heart failure, aneurysm, and CAD.  Heart muscle function.  Heart valve function.  Signs of a past heart attack.  Fluid buildup around the heart.  Thickening of the heart muscle.  A tumor or infectious growth around the heart valves. Tell a health care provider about:  Any allergies you have.  All medicines you are taking, including vitamins, herbs, eye drops, creams, and over-the-counter medicines.  Any blood disorders you have.  Any surgeries you have had.  Any medical conditions you have.  Whether you are pregnant or may be pregnant. What are the risks? Generally, this is a safe procedure. However, problems may occur, including:  Allergic reaction to dye (contrast) that may be used during the procedure. What happens before the procedure? No specific preparation is needed. You may eat and drink normally. What happens during the  procedure?   An IV tube may be inserted into one of your veins.  You may receive contrast through this tube. A contrast is an injection that improves the quality of the pictures from your heart.  A gel will be applied to your chest.  A wand-like tool (transducer) will be moved over your chest. The gel will  help to transmit the sound waves from the transducer.  The sound waves will harmlessly bounce off of your heart to allow the heart images to be captured in real-time motion. The images will be recorded on a computer. The procedure may vary among health care providers and hospitals. What happens after the procedure?  You may return to your normal, everyday life, including diet, activities, and medicines, unless your health care provider tells you not to do that. Summary  An echocardiogram is a procedure that uses painless sound waves (ultrasound) to produce an image of the heart.  Images from an echocardiogram can provide important information about the size and shape of your heart, heart muscle function, heart valve function, and fluid buildup around your heart.  You do not need to do anything to prepare before this procedure. You may eat and drink normally.  After the echocardiogram is completed, you may return to your normal, everyday life, unless your health care provider tells you not to do that. This information is not intended to replace advice given to you by your health care provider. Make sure you discuss any questions you have with your health care provider. Document Released: 09/14/2000 Document Revised: 10/20/2016 Document Reviewed: 10/20/2016 Elsevier Interactive Patient Education  2019 [employer].

## 2018-11-13 ENCOUNTER — Encounter: Payer: Self-pay | Admitting: Emergency Medicine

## 2018-11-19 DIAGNOSIS — R6889 Other general symptoms and signs: Secondary | ICD-10-CM | POA: Diagnosis not present

## 2018-11-19 DIAGNOSIS — J40 Bronchitis, not specified as acute or chronic: Secondary | ICD-10-CM | POA: Diagnosis not present

## 2018-11-19 DIAGNOSIS — J329 Chronic sinusitis, unspecified: Secondary | ICD-10-CM | POA: Diagnosis not present

## 2018-11-19 DIAGNOSIS — R509 Fever, unspecified: Secondary | ICD-10-CM | POA: Diagnosis not present

## 2018-11-27 ENCOUNTER — Telehealth: Payer: Self-pay | Admitting: *Deleted

## 2018-11-27 NOTE — Telephone Encounter (Signed)
Pt called and requested lab results to be mailed to him. Mailed out today.

## 2018-12-09 DIAGNOSIS — E039 Hypothyroidism, unspecified: Secondary | ICD-10-CM | POA: Diagnosis not present

## 2018-12-09 DIAGNOSIS — I10 Essential (primary) hypertension: Secondary | ICD-10-CM | POA: Diagnosis not present

## 2018-12-09 DIAGNOSIS — I2510 Atherosclerotic heart disease of native coronary artery without angina pectoris: Secondary | ICD-10-CM | POA: Diagnosis not present

## 2018-12-09 DIAGNOSIS — R011 Cardiac murmur, unspecified: Secondary | ICD-10-CM | POA: Diagnosis not present

## 2018-12-09 DIAGNOSIS — C9110 Chronic lymphocytic leukemia of B-cell type not having achieved remission: Secondary | ICD-10-CM | POA: Diagnosis not present

## 2018-12-20 ENCOUNTER — Other Ambulatory Visit: Payer: Self-pay | Admitting: Cardiology

## 2018-12-22 ENCOUNTER — Other Ambulatory Visit: Payer: Self-pay | Admitting: Cardiology

## 2018-12-30 ENCOUNTER — Telehealth: Payer: Self-pay | Admitting: *Deleted

## 2018-12-30 NOTE — Telephone Encounter (Signed)
Cancelled echo and carotid

## 2019-01-13 NOTE — Telephone Encounter (Signed)
Recalls entered for Echo and Carotid

## 2019-01-14 ENCOUNTER — Other Ambulatory Visit: Payer: Medicare Other

## 2019-02-03 DIAGNOSIS — C9110 Chronic lymphocytic leukemia of B-cell type not having achieved remission: Secondary | ICD-10-CM | POA: Diagnosis not present

## 2019-02-03 DIAGNOSIS — E039 Hypothyroidism, unspecified: Secondary | ICD-10-CM | POA: Diagnosis not present

## 2019-02-03 DIAGNOSIS — Z0000 Encounter for general adult medical examination without abnormal findings: Secondary | ICD-10-CM | POA: Diagnosis not present

## 2019-02-03 DIAGNOSIS — E785 Hyperlipidemia, unspecified: Secondary | ICD-10-CM | POA: Diagnosis not present

## 2019-02-03 DIAGNOSIS — Z79899 Other long term (current) drug therapy: Secondary | ICD-10-CM | POA: Diagnosis not present

## 2019-02-03 DIAGNOSIS — R739 Hyperglycemia, unspecified: Secondary | ICD-10-CM | POA: Diagnosis not present

## 2019-02-03 DIAGNOSIS — I10 Essential (primary) hypertension: Secondary | ICD-10-CM | POA: Diagnosis not present

## 2019-02-16 DIAGNOSIS — Z1211 Encounter for screening for malignant neoplasm of colon: Secondary | ICD-10-CM | POA: Diagnosis not present

## 2019-02-28 DIAGNOSIS — E785 Hyperlipidemia, unspecified: Secondary | ICD-10-CM | POA: Diagnosis not present

## 2019-02-28 DIAGNOSIS — I10 Essential (primary) hypertension: Secondary | ICD-10-CM | POA: Diagnosis not present

## 2019-03-19 ENCOUNTER — Other Ambulatory Visit: Payer: Self-pay | Admitting: Cardiology

## 2019-06-03 ENCOUNTER — Ambulatory Visit: Payer: Medicare Other | Admitting: Cardiology

## 2019-06-11 ENCOUNTER — Ambulatory Visit: Payer: Medicare Other | Admitting: Cardiology

## 2019-06-16 DIAGNOSIS — Z23 Encounter for immunization: Secondary | ICD-10-CM | POA: Diagnosis not present

## 2019-06-17 ENCOUNTER — Other Ambulatory Visit: Payer: Self-pay | Admitting: Cardiology

## 2019-06-23 ENCOUNTER — Other Ambulatory Visit: Payer: Self-pay | Admitting: Cardiology

## 2019-06-23 MED ORDER — EZETIMIBE 10 MG PO TABS: 10.0000 mg | ORAL_TABLET | Freq: Every day | ORAL | 0 refills | Status: DC

## 2019-06-23 NOTE — Addendum Note (Signed)
Addended by: Austin Miles on: 06/23/2019 12:52 PM   Modules accepted: Orders

## 2019-06-23 NOTE — Telephone Encounter (Signed)
Refill for ezetimibe sent to [facility] in [place] as requested.

## 2019-06-23 NOTE — Telephone Encounter (Signed)
Please call in Ezetimibe 10 mg to the [facility] in [place].

## 2019-06-26 ENCOUNTER — Ambulatory Visit (INDEPENDENT_AMBULATORY_CARE_PROVIDER_SITE_OTHER): Payer: Medicare Other

## 2019-06-26 ENCOUNTER — Other Ambulatory Visit: Payer: Self-pay

## 2019-06-26 DIAGNOSIS — I350 Nonrheumatic aortic (valve) stenosis: Secondary | ICD-10-CM

## 2019-06-26 DIAGNOSIS — I25110 Atherosclerotic heart disease of native coronary artery with unstable angina pectoris: Secondary | ICD-10-CM

## 2019-06-26 NOTE — Progress Notes (Signed)
Complete echocardiogram has been performed.  Jimmy Fredis Malkiewicz RDCS, RVT 

## 2019-06-26 NOTE — Progress Notes (Signed)
Carotid duplex exam has been performed. Bilateral ICA stenosis 60-79%, Bilateral ECA's appear >50% stenosed.  Jimmy Troi Bechtold RDCS, RVT

## 2019-07-21 ENCOUNTER — Other Ambulatory Visit: Payer: Self-pay

## 2019-07-21 ENCOUNTER — Ambulatory Visit (INDEPENDENT_AMBULATORY_CARE_PROVIDER_SITE_OTHER): Payer: Medicare Other | Admitting: Cardiology

## 2019-07-21 VITALS — BP 150/60 | HR 67 | Ht 67.0 in | Wt 199.8 lb

## 2019-07-21 DIAGNOSIS — I350 Nonrheumatic aortic (valve) stenosis: Secondary | ICD-10-CM

## 2019-07-21 DIAGNOSIS — I739 Peripheral vascular disease, unspecified: Secondary | ICD-10-CM | POA: Diagnosis not present

## 2019-07-21 DIAGNOSIS — E782 Mixed hyperlipidemia: Secondary | ICD-10-CM | POA: Diagnosis not present

## 2019-07-21 DIAGNOSIS — Z951 Presence of aortocoronary bypass graft: Secondary | ICD-10-CM

## 2019-07-21 DIAGNOSIS — I10 Essential (primary) hypertension: Secondary | ICD-10-CM | POA: Diagnosis not present

## 2019-07-21 MED ORDER — EZETIMIBE 10 MG PO TABS: 10.0000 mg | ORAL_TABLET | Freq: Every day | ORAL | 1 refills | Status: DC

## 2019-07-21 MED ORDER — METOPROLOL SUCCINATE ER 25 MG PO TB24: 25.0000 mg | ORAL_TABLET | Freq: Every day | ORAL | 1 refills | Status: DC

## 2019-07-21 MED ORDER — FUROSEMIDE 20 MG PO TABS: 20.0000 mg | ORAL_TABLET | Freq: Every day | ORAL | 1 refills | Status: DC

## 2019-07-21 MED ORDER — NITROGLYCERIN 0.4 MG SL SUBL: 0.4000 mg | SUBLINGUAL_TABLET | SUBLINGUAL | 2 refills | Status: DC | PRN

## 2019-07-21 NOTE — Progress Notes (Signed)
Cardiology Office Note:    Date:  07/21/2019   ID:  James Hammond, DOB July 08, 1941, MRN [id number]  PCP:  Street, James Mt, MD  Cardiologist:  James Campus, MD    Referring MD: Street, James Hammond, *   Chief Complaint  Patient presents with  . Follow up on testing  Doing very well  History of Present Illness:    James Hammond is a 78 y.o. male with coronary disease, status post coronary to bypass graft years ago, dyslipidemia, peripheral vascular disease with both intracardiac arteries up to 79% stenosis, no recent CVA, also aortic stenosis last assessment in September 2020 showing mild to moderate aortic stenosis.  Comes today to my office for follow-up overall doing great chief complaint that he has a swelling of the left lower extremities gets the leg today took vein for bypass surgery that bothers him some described to have some heavy in the leg as well as pain we talked about potentially wearing elastic stockings he does not want to do that.  Otherwise he is enjoying gardening he is very active he is planning to go camping for next week.  Overall seems to be doing well  Past Medical History:  Diagnosis Date  . Arthritis    "left hip, lower back" (01/01/2018)  . CLL (chronic lymphocytic leukemia) ([place])    "never had tx for it" (01/01/2018)  . Coronary artery disease   . Gallstone   . GERD (gastroesophageal reflux disease)   . Heart murmur    told recently that he has a slight murmur   . Hepatitis    as a child   . Hiatal hernia   . History of kidney stones   . Hyperlipidemia   . Hypertension   . Hypothyroidism    doesn't need treatment at [facility] point   . Kidney cysts   . Urticaria     Past Surgical History:  Procedure Laterality Date  . APPENDECTOMY     78 y.o.  . BACK SURGERY    . CARDIAC CATHETERIZATION  06/2016   "couldn't get thru so they did bypass OR"  . CORONARY ARTERY BYPASS GRAFT  06/2016   CABG X4  . CORONARY STENT INTERVENTION N/A 01/01/2018    Procedure: CORONARY STENT INTERVENTION;  Surgeon: Wellington Hampshire, MD;  Location: [place] [facility];  Service: Cardiovascular;  Laterality: N/A;  SVG to OM ostium  . INCISION AND DRAINAGE ABSCESS ANAL  1968  . LUMBAR LAMINECTOMY/DECOMPRESSION MICRODISCECTOMY Right 05/13/2015   Procedure: Microdiscectomy - Lumbar three--four  Lumbar four-five - right;  Surgeon: Kary Kos, MD;  Location: [place] [facility];  Service: Neurosurgery;  Laterality: Right;  . PILONIDAL CYST EXCISION  1965   required 30 day hosp. due to  (infection) being in the [employer] & [place] the [place]   . RIGHT/LEFT HEART CATH AND CORONARY/GRAFT ANGIOGRAPHY N/A 01/01/2018   Procedure: RIGHT/LEFT HEART CATH AND CORONARY/GRAFT ANGIOGRAPHY;  Surgeon: Wellington Hampshire, MD;  Location: [place] [facility];  Service: Cardiovascular;  Laterality: N/A;    Current Medications: Current Meds  Medication Sig  . aspirin 81 MG tablet Take 81 mg by mouth daily.  . cetirizine (ZYRTEC) 10 MG tablet Take 1 tablet daily at bedtime  . clopidogrel (PLAVIX) 75 MG tablet TAKE ONE TABLET BY MOUTH ONCE DAILY WITH BREAKFAST  . ezetimibe (ZETIA) 10 MG tablet Take 1 tablet (10 mg total) by mouth daily.  . famotidine (PEPCID) 10 MG tablet Take 10 mg by  mouth 2 (two) times daily.  Marland Kitchen levothyroxine (SYNTHROID, LEVOTHROID) 25 MCG tablet Take 25 mcg by mouth See admin instructions. 1 Daily except 2 on Thursday and Sunday  . metoprolol succinate (TOPROL-XL) 25 MG 24 hr tablet Take 25 mg by mouth daily.  . nitroGLYCERIN (NITROSTAT) 0.4 MG SL tablet Place 1 tablet (0.4 mg total) under the tongue every 5 (five) minutes as needed.  . pravastatin (PRAVACHOL) 40 MG tablet Take 1 tablet (40 mg total) by mouth every evening.     Allergies:   Protamine   Social History   Socioeconomic History  . Marital status: Married    Spouse name: Not on file  . Number of children: Not on file  . Years of education: Not on file  . Highest education level: Not on file   Occupational History  . Not on file  Social Needs  . Financial resource strain: Not on file  . Food insecurity    Worry: Not on file    Inability: Not on file  . Transportation needs    Medical: Not on file    Non-medical: Not on file  Tobacco Use  . Smoking status: Former Smoker    Packs/day: 1.00    Years: 25.00    Pack years: 25.00    Types: Cigarettes    Quit date: 05/09/1983    Years since quitting: 36.2  . Smokeless tobacco: Never Used  Substance and Sexual Activity  . Alcohol use: No  . Drug use: No  . Sexual activity: Yes  Lifestyle  . Physical activity    Days per week: Not on file    Minutes per session: Not on file  . Stress: Not on file  Relationships  . Social Herbalist on phone: Not on file    Gets together: Not on file    Attends religious service: Not on file    Active member of club or organization: Not on file    Attends meetings of clubs or organizations: Not on file    Relationship status: Not on file  Other Topics Concern  . Not on file  Social History Narrative  . Not on file     Family History: The patient's family history includes Cancer in his sister; Heart attack in his mother; Prostate cancer in his brother; Stroke in his brother and father. There is no history of Allergic rhinitis, Angioedema, Asthma, Atopy, Eczema, or Immunodeficiency. ROS:   Please see the history of present illness.    All 14 point review of systems negative except as described per history of present illness  EKGs/Labs/Other Studies Reviewed:      Recent Labs: 11/12/2018: ALT 17  Recent Lipid Panel    Component Value Date/Time   CHOL 150 11/12/2018 0832   TRIG 133 11/12/2018 0832   HDL 62 11/12/2018 0832   CHOLHDL 2.4 11/12/2018 0832   LDLCALC 61 11/12/2018 0832    Physical Exam:    VS:  BP (!) 150/60   Pulse 67   Ht 5\' 7"  (1.702 m)   Wt 199 lb 12.8 oz (90.6 kg)   SpO2 98%   BMI 31.29 kg/m     Wt Readings from Last 3 Encounters:   07/21/19 199 lb 12.8 oz (90.6 kg)  11/12/18 200 lb 9.6 oz (91 kg)  07/17/18 202 lb 6.4 oz (91.8 kg)     GEN:  Well nourished, well developed in no acute distress HEENT: Normal NECK: No JVD; No carotid bruits  LYMPHATICS: No lymphadenopathy CARDIAC: RRR, systolic ejection murmur grade 2 through 3/6 best heard at the right upper portion of the sternum, no rubs, no gallops RESPIRATORY:  Clear to auscultation without rales, wheezing or rhonchi  ABDOMEN: Soft, non-tender, non-distended MUSCULOSKELETAL:  No edema; No deformity  SKIN: Warm and dry LOWER EXTREMITIES: no swelling NEUROLOGIC:  Alert and oriented x 3 PSYCHIATRIC:  Normal affect   ASSESSMENT:    1. PVD (peripheral vascular disease) ([place])   2. Mixed hyperlipidemia   3. Status post coronary artery bypass graft   4. Nonrheumatic aortic valve stenosis   5. Benign hypertension    PLAN:    In order of problems listed above:  1. Peripheral vascular disease stable on appropriate medication which I will continue. 2. Mixed dyslipidemia we will schedule him to have a fasting lipid profile will probably augment his medical therapy 3. Status post coronary bypass graft stable no new problems. 4. Nonrheumatic aortic valve stenosis last estimation by echocardiogram showed mild to moderate stenosis.  Continue monitoring. 5. Essential hypertension mild elevation today in the office but at home always 1 [id number] systolic.  I will add small dose of diuretic only 20 mg of furosemide every day to help with the swelling of his left lower extremities.  Chem-7 will be checked week from today.  That should also help with his blood pressure.   Medication Adjustments/Labs and Tests Ordered: Current medicines are reviewed at length with the patient today.  Concerns regarding medicines are outlined above.  No orders of the defined types were placed in this encounter.  Medication changes: No orders of the defined types were placed in this encounter.    Signed, Park Liter, MD, [facility] 07/21/2019 1:59 PM    [place]

## 2019-07-21 NOTE — Patient Instructions (Signed)
Medication Instructions:  Your physician has recommended you make the following change in your medication:   START: Lasix 20 mg daily   *If you need a refill on your cardiac medications before your next appointment, please call your pharmacy*  Lab Work: Your physician recommends that you return for lab work in 1 week fasting: bmp lipids   If you have labs (blood work) drawn today and your tests are completely normal, you will receive your results only by: Marland Kitchen MyChart Message (if you have MyChart) OR . A paper copy in the mail If you have any lab test that is abnormal or we need to change your treatment, we will call you to review the results.  Testing/Procedures: None.   Follow-Up: At [facility], you and your health needs are our priority.  As part of our continuing mission to provide you with exceptional heart care, we have created designated Provider Care Teams.  These Care Teams include your primary Cardiologist (physician) and Advanced Practice Providers (APPs -  Physician Assistants and Nurse Practitioners) who all work together to provide you with the care you need, when you need it.  Your next appointment:   6 months  The format for your next appointment:   In Person  Provider:   Jenne Campus, MD  Other Instructions  Furosemide tablets What is this medicine? FUROSEMIDE (fyoor [place] se mide) is a diuretic. It helps you make more urine and to lose salt and excess water from your body. This medicine is used to treat high blood pressure, and edema or swelling from heart, kidney, or liver disease. This medicine may be used for other purposes; ask your health care provider or pharmacist if you have questions. COMMON BRAND NAME(S): Active-Medicated Specimen Kit, Delone, Diuscreen, Lasix, RX Specimen Collection Kit, Specimen Collection Kit, URINX Medicated Specimen Collection What should I tell my health care provider before I take this medicine? They need to know if you have  any of these conditions:  abnormal blood electrolytes  diarrhea or vomiting  gout  heart disease  kidney disease, small amounts of urine, or difficulty passing urine  liver disease  thyroid disease  an unusual or allergic reaction to furosemide, sulfa drugs, other medicines, foods, dyes, or preservatives  pregnant or trying to get pregnant  breast-feeding How should I use this medicine? Take this medicine by mouth with a glass of water. Follow the directions on the prescription label. You may take this medicine with or without food. If it upsets your stomach, take it with food or milk. Do not take your medicine more often than directed. Remember that you will need to pass more urine after taking this medicine. Do not take your medicine at a time of day that will cause you problems. Do not take at bedtime. Talk to your pediatrician regarding the use of this medicine in children. While this drug may be prescribed for selected conditions, precautions do apply. Overdosage: If you think you have taken too much of this medicine contact a poison control center or emergency room at once. NOTE: This medicine is only for you. Do not share this medicine with others. What if I miss a dose? If you miss a dose, take it as soon as you can. If it is almost time for your next dose, take only that dose. Do not take double or extra doses. What may interact with this medicine?  aspirin and aspirin-like medicines  certain antibiotics  chloral hydrate  cisplatin  cyclosporine  digoxin  diuretics  laxatives  lithium  medicines for blood pressure  medicines that relax muscles for surgery  methotrexate  NSAIDs, medicines for pain and inflammation like ibuprofen, naproxen, or indomethacin  phenytoin  steroid medicines like prednisone or cortisone  sucralfate  thyroid hormones This list may not describe all possible interactions. Give your health care provider a list of all the  medicines, herbs, non-prescription drugs, or dietary supplements you use. Also tell them if you smoke, drink alcohol, or use illegal drugs. Some items may interact with your medicine. What should I watch for while using this medicine? Visit your doctor or health care provider for regular checks on your progress. Check your blood pressure regularly. Ask your doctor or health care provider what your blood pressure should be, and when you should contact him or her. If you are a diabetic, check your blood sugar as directed. This medicine may cause serious skin reactions. They can happen weeks to months after starting the medicine. Contact your health care provider right away if you notice fevers or flu-like symptoms with a rash. The rash may be red or purple and then turn into blisters or peeling of the skin. Or, you might notice a red rash with swelling of the face, lips or lymph nodes in your neck or under your arms. You may need to be on a special diet while taking this medicine. Check with your doctor. Also, ask how many glasses of fluid you need to drink a day. You must not get dehydrated. You may get drowsy or dizzy. Do not drive, use machinery, or do anything that needs mental alertness until you know how this drug affects you. Do not stand or sit up quickly, especially if you are an older patient. This reduces the risk of dizzy or fainting spells. Alcohol can make you more drowsy and dizzy. Avoid alcoholic drinks. This medicine can make you more sensitive to the sun. Keep out of the sun. If you cannot avoid being in the sun, wear protective clothing and use sunscreen. Do not use sun lamps or tanning beds/booths. What side effects may I notice from receiving this medicine? Side effects that you should report to your doctor or health care professional as soon as possible:  blood in urine or stools  dry mouth  fever or chills  hearing loss or ringing in the ears  irregular heartbeat  muscle pain  or weakness, cramps  rash, fever, and swollen lymph nodes  redness, blistering, peeling or loosening of the skin, including inside the mouth  skin rash  stomach upset, pain, or nausea  tingling or numbness in the hands or feet  unusually weak or tired  vomiting or diarrhea  yellowing of the eyes or skin Side effects that usually do not require medical attention (report to your doctor or health care professional if they continue or are bothersome):  headache  loss of appetite  unusual bleeding or bruising This list may not describe all possible side effects. Call your doctor for medical advice about side effects. You may report side effects to FDA at 1-800-FDA-1088. Where should I keep my medicine? Keep out of the reach of children. Store at room temperature between 15 and 30 degrees C (59 and 86 degrees F). Protect from light. Throw away any unused medicine after the expiration date. NOTE: This sheet is a summary. It may not cover all possible information. If you have questions about this medicine, talk to your doctor, pharmacist, or health care provider.  2020 Elsevier/Gold Standard (2018-12-19 14:04:13)

## 2019-08-03 DIAGNOSIS — E782 Mixed hyperlipidemia: Secondary | ICD-10-CM | POA: Diagnosis not present

## 2019-08-03 DIAGNOSIS — I10 Essential (primary) hypertension: Secondary | ICD-10-CM | POA: Diagnosis not present

## 2019-08-03 DIAGNOSIS — I739 Peripheral vascular disease, unspecified: Secondary | ICD-10-CM | POA: Diagnosis not present

## 2019-08-03 LAB — BASIC METABOLIC PANEL
BUN/Creatinine Ratio: 10 (ref 10–24)
BUN: 12 mg/dL (ref 8–27)
CO2: 23 mmol/L (ref 20–29)
Calcium: 9.9 mg/dL (ref 8.6–10.2)
Chloride: 103 mmol/L (ref 96–106)
Creatinine, Ser: 1.19 mg/dL (ref 0.76–1.27)
GFR calc Af Amer: 68 mL/min/{1.73_m2} (ref >59)
GFR calc non Af Amer: 59 mL/min/{1.73_m2} — ABNORMAL LOW (ref >59)
Glucose: 99 mg/dL (ref 65–99)
Potassium: 4.7 mmol/L (ref 3.5–5.2)
Sodium: 141 mmol/L (ref 134–144)

## 2019-08-03 LAB — LIPID PANEL
Chol/HDL Ratio: 2.4 ratio (ref 0.0–5.0)
Cholesterol, Total: 155 mg/dL (ref 100–199)
HDL: 65 mg/dL (ref >39)
LDL Chol Calc (NIH): 62 mg/dL (ref 0–99)
Triglycerides: 168 mg/dL — ABNORMAL HIGH (ref 0–149)
VLDL Cholesterol Cal: 28 mg/dL (ref 5–40)

## 2019-08-25 DIAGNOSIS — M722 Plantar fascial fibromatosis: Secondary | ICD-10-CM | POA: Diagnosis not present

## 2019-08-25 DIAGNOSIS — Z79899 Other long term (current) drug therapy: Secondary | ICD-10-CM | POA: Diagnosis not present

## 2019-08-25 DIAGNOSIS — M7581 Other shoulder lesions, right shoulder: Secondary | ICD-10-CM | POA: Diagnosis not present

## 2019-08-25 DIAGNOSIS — E039 Hypothyroidism, unspecified: Secondary | ICD-10-CM | POA: Diagnosis not present

## 2019-08-25 DIAGNOSIS — M7551 Bursitis of right shoulder: Secondary | ICD-10-CM | POA: Diagnosis not present

## 2019-09-01 DIAGNOSIS — L821 Other seborrheic keratosis: Secondary | ICD-10-CM | POA: Diagnosis not present

## 2019-09-01 DIAGNOSIS — L578 Other skin changes due to chronic exposure to nonionizing radiation: Secondary | ICD-10-CM | POA: Diagnosis not present

## 2019-09-01 DIAGNOSIS — L570 Actinic keratosis: Secondary | ICD-10-CM | POA: Diagnosis not present

## 2019-09-01 DIAGNOSIS — D225 Melanocytic nevi of trunk: Secondary | ICD-10-CM | POA: Diagnosis not present

## 2019-09-01 DIAGNOSIS — L814 Other melanin hyperpigmentation: Secondary | ICD-10-CM | POA: Diagnosis not present

## 2019-09-15 ENCOUNTER — Other Ambulatory Visit: Payer: Self-pay | Admitting: Cardiology

## 2019-11-09 DIAGNOSIS — D47Z9 Other specified neoplasms of uncertain behavior of lymphoid, hematopoietic and related tissue: Secondary | ICD-10-CM | POA: Diagnosis not present

## 2019-11-09 DIAGNOSIS — D479 Neoplasm of uncertain behavior of lymphoid, hematopoietic and related tissue, unspecified: Secondary | ICD-10-CM | POA: Diagnosis not present

## 2020-03-10 DIAGNOSIS — Z955 Presence of coronary angioplasty implant and graft: Secondary | ICD-10-CM | POA: Diagnosis not present

## 2020-03-10 DIAGNOSIS — I2510 Atherosclerotic heart disease of native coronary artery without angina pectoris: Secondary | ICD-10-CM | POA: Diagnosis not present

## 2020-03-10 DIAGNOSIS — Z9889 Other specified postprocedural states: Secondary | ICD-10-CM | POA: Diagnosis not present

## 2020-03-10 DIAGNOSIS — R509 Fever, unspecified: Secondary | ICD-10-CM | POA: Diagnosis not present

## 2020-03-10 DIAGNOSIS — R011 Cardiac murmur, unspecified: Secondary | ICD-10-CM | POA: Diagnosis not present

## 2020-03-11 ENCOUNTER — Telehealth: Payer: Self-pay

## 2020-03-11 ENCOUNTER — Telehealth: Payer: Self-pay | Admitting: Cardiology

## 2020-03-11 ENCOUNTER — Other Ambulatory Visit: Payer: Self-pay | Admitting: Cardiology

## 2020-03-11 DIAGNOSIS — M79672 Pain in left foot: Secondary | ICD-10-CM | POA: Diagnosis not present

## 2020-03-11 DIAGNOSIS — M7989 Other specified soft tissue disorders: Secondary | ICD-10-CM | POA: Diagnosis not present

## 2020-03-11 DIAGNOSIS — Z87891 Personal history of nicotine dependence: Secondary | ICD-10-CM | POA: Diagnosis not present

## 2020-03-11 DIAGNOSIS — K219 Gastro-esophageal reflux disease without esophagitis: Secondary | ICD-10-CM | POA: Diagnosis not present

## 2020-03-11 DIAGNOSIS — I10 Essential (primary) hypertension: Secondary | ICD-10-CM | POA: Diagnosis not present

## 2020-03-11 DIAGNOSIS — L03116 Cellulitis of left lower limb: Secondary | ICD-10-CM | POA: Diagnosis not present

## 2020-03-11 NOTE — Telephone Encounter (Signed)
Spoke with pt's daughter in law who states that the pt's lower leg is red swollen and painful. After speaking with Dr.Tobb the pt was recommended to go to the ED for evaluation. Pt's daughter in law verbalized understanding and had no questions.

## 2020-03-11 NOTE — Telephone Encounter (Signed)
Patient calling stating his left leg is swollen, red, hot and hurts. He is currently with a nurse Lenord Fellers who states this is urgent and he may have a possible clot.

## 2020-03-11 NOTE — Telephone Encounter (Signed)
See previous phone note.  

## 2020-03-14 DIAGNOSIS — L03116 Cellulitis of left lower limb: Secondary | ICD-10-CM | POA: Diagnosis not present

## 2020-03-17 ENCOUNTER — Ambulatory Visit: Payer: Medicare Other | Admitting: Sports Medicine

## 2020-03-25 ENCOUNTER — Other Ambulatory Visit: Payer: Self-pay

## 2020-03-29 ENCOUNTER — Other Ambulatory Visit: Payer: Self-pay

## 2020-03-29 ENCOUNTER — Encounter: Payer: Self-pay | Admitting: Cardiology

## 2020-03-29 ENCOUNTER — Ambulatory Visit: Payer: Medicare Other | Admitting: Cardiology

## 2020-03-29 VITALS — BP 162/76 | HR 75 | Ht 67.0 in | Wt 199.4 lb

## 2020-03-29 DIAGNOSIS — I739 Peripheral vascular disease, unspecified: Secondary | ICD-10-CM | POA: Diagnosis not present

## 2020-03-29 DIAGNOSIS — Z951 Presence of aortocoronary bypass graft: Secondary | ICD-10-CM

## 2020-03-29 DIAGNOSIS — I350 Nonrheumatic aortic (valve) stenosis: Secondary | ICD-10-CM | POA: Diagnosis not present

## 2020-03-29 DIAGNOSIS — C9110 Chronic lymphocytic leukemia of B-cell type not having achieved remission: Secondary | ICD-10-CM | POA: Diagnosis not present

## 2020-03-29 NOTE — Patient Instructions (Signed)
Medication Instructions:  Your physician recommends that you continue on your current medications as directed. Please refer to the Current Medication list given to you today.  *If you need a refill on your cardiac medications before your next appointment, please call your pharmacy*   Lab Work: None.  If you have labs (blood work) drawn today and your tests are completely normal, you will receive your results only by: Marland Kitchen MyChart Message (if you have MyChart) OR . A paper copy in the mail If you have any lab test that is abnormal or we need to change your treatment, we will call you to review the results.   Testing/Procedures: Your physician has requested that you have an echocardiogram. Echocardiography is a painless test that uses sound waves to create images of your heart. It provides your doctor with information about the size and shape of your heart and how well your heart's chambers and valves are working. This procedure takes approximately one hour. There are no restrictions for this procedure.  Your physician has requested that you have a carotid duplex. This test is an ultrasound of the carotid arteries in your neck. It looks at blood flow through these arteries that supply the brain with blood. Allow one hour for this exam. There are no restrictions or special instructions.     Follow-Up: At [facility], you and your health needs are our priority.  As part of our continuing mission to provide you with exceptional heart care, we have created designated Provider Care Teams.  These Care Teams include your primary Cardiologist (physician) and Advanced Practice Providers (APPs -  Physician Assistants and Nurse Practitioners) who all work together to provide you with the care you need, when you need it.  We recommend signing up for the patient portal called "MyChart".  Sign up information is provided on this After Visit Summary.  MyChart is used to connect with patients for Virtual  Visits (Telemedicine).  Patients are able to view lab/test results, encounter notes, upcoming appointments, etc.  Non-urgent messages can be sent to your provider as well.   To learn more about what you can do with MyChart, go to [email or web address].    Your next appointment:   6 month(s)  The format for your next appointment:   In Person  Provider:   Jenne Campus, MD   Other Instructions   Echocardiogram An echocardiogram is a procedure that uses painless sound waves (ultrasound) to produce an image of the heart. Images from an echocardiogram can provide important information about:  Signs of coronary artery disease (CAD).  Aneurysm detection. An aneurysm is a weak or damaged part of an artery wall that bulges out from the normal force of blood pumping through the body.  Heart size and shape. Changes in the size or shape of the heart can be associated with certain conditions, including heart failure, aneurysm, and CAD.  Heart muscle function.  Heart valve function.  Signs of a past heart attack.  Fluid buildup around the heart.  Thickening of the heart muscle.  A tumor or infectious growth around the heart valves. Tell a health care provider about:  Any allergies you have.  All medicines you are taking, including vitamins, herbs, eye drops, creams, and over-the-counter medicines.  Any blood disorders you have.  Any surgeries you have had.  Any medical conditions you have.  Whether you are pregnant or may be pregnant. What are the risks? Generally, this is a safe procedure. However, problems may occur, including:  Allergic reaction to dye (contrast) that may be used during the procedure. What happens before the procedure? No specific preparation is needed. You may eat and drink normally. What happens during the procedure?   An IV tube may be inserted into one of your veins.  You may receive contrast through this tube. A contrast is an injection that  improves the quality of the pictures from your heart.  A gel will be applied to your chest.  A wand-like tool (transducer) will be moved over your chest. The gel will help to transmit the sound waves from the transducer.  The sound waves will harmlessly bounce off of your heart to allow the heart images to be captured in real-time motion. The images will be recorded on a computer. The procedure may vary among health care providers and hospitals. What happens after the procedure?  You may return to your normal, everyday life, including diet, activities, and medicines, unless your health care provider tells you not to do that. Summary  An echocardiogram is a procedure that uses painless sound waves (ultrasound) to produce an image of the heart.  Images from an echocardiogram can provide important information about the size and shape of your heart, heart muscle function, heart valve function, and fluid buildup around your heart.  You do not need to do anything to prepare before this procedure. You may eat and drink normally.  After the echocardiogram is completed, you may return to your normal, everyday life, unless your health care provider tells you not to do that. This information is not intended to replace advice given to you by your health care provider. Make sure you discuss any questions you have with your health care provider. Document Revised: 01/08/2019 Document Reviewed: 10/20/2016 Elsevier Patient Education  [place].

## 2020-03-29 NOTE — Progress Notes (Signed)
Cardiology Office Note:    Date:  03/29/2020   ID:  Other, Atienza November 13, 1940, MRN [id number]  PCP:  Street, [name], MD  Cardiologist:  Jenne Campus, MD    Referring MD: [address] [name], *   No chief complaint on file. I am doing fine  History of Present Illness:    James Hammond is a 79 y.o. male with past medical history significant for coronary artery disease, status post coronary artery bypass graft years ago, mild to moderate aortic stenosis based on echocardiogram from September 2020, up to 79% stenosis of both carotic internal arteries based on ultrasounds done in September 2020.  He also has CLL as well as dyslipidemia.  Comes today 2 months of follow-up overall doing well.  Denies have any chest pain tightness squeezing pressure burning chest.  He like to do woodwork and have no difficulty doing it.  Past Medical History:  Diagnosis Date  . Arthritis    "left hip, lower back" (01/01/2018)  . CLL (chronic lymphocytic leukemia) ([place])    "never had tx for it" (01/01/2018)  . Coronary artery disease   . Gallstone   . GERD (gastroesophageal reflux disease)   . Heart murmur    told recently that he has a slight murmur   . Hepatitis    as a child   . Hiatal hernia   . History of kidney stones   . Hyperlipidemia   . Hypertension   . Hypothyroidism    doesn't need treatment at [facility] point   . Kidney cysts   . Urticaria     Past Surgical History:  Procedure Laterality Date  . APPENDECTOMY     79 y.o.  . BACK SURGERY    . CARDIAC CATHETERIZATION  06/2016   "couldn't get thru so they did bypass OR"  . CORONARY ARTERY BYPASS GRAFT  06/2016   CABG X4  . CORONARY STENT INTERVENTION N/A 01/01/2018   Procedure: CORONARY STENT INTERVENTION;  Surgeon: Wellington Hampshire, MD;  Location: [place] CV LAB;  Service: Cardiovascular;  Laterality: N/A;  SVG to OM ostium  . INCISION AND DRAINAGE ABSCESS ANAL  1968  . LUMBAR LAMINECTOMY/DECOMPRESSION  MICRODISCECTOMY Right 05/13/2015   Procedure: Microdiscectomy - Lumbar three--four  Lumbar four-five - right;  Surgeon: Kary Kos, MD;  Location: [place] NEURO ORS;  Service: Neurosurgery;  Laterality: Right;  . PILONIDAL CYST EXCISION  1965   required 30 day hosp. due to  (infection) being in the [employer] & [place]   . RIGHT/LEFT HEART CATH AND CORONARY/GRAFT ANGIOGRAPHY N/A 01/01/2018   Procedure: RIGHT/LEFT HEART CATH AND CORONARY/GRAFT ANGIOGRAPHY;  Surgeon: Wellington Hampshire, MD;  Location: [place] CV LAB;  Service: Cardiovascular;  Laterality: N/A;    Current Medications: Current Meds  Medication Sig  . aspirin 81 MG tablet Take 81 mg by mouth daily.  . cephALEXin (KEFLEX) 500 MG capsule Take 500 mg by mouth 2 (two) times daily.  . cetirizine (ZYRTEC) 10 MG tablet Take 1 tablet daily at bedtime  . clopidogrel (PLAVIX) 75 MG tablet TAKE ONE TABLET BY MOUTH ONCE DAILY WITH BREAKFAST  . doxycycline (VIBRA-TABS) 100 MG tablet Take 100 mg by mouth 2 (two) times daily.  Marland Kitchen EPINEPHrine 0.3 mg/0.3 mL IJ SOAJ injection AS NEEDED FOR SEVERE ALLERGIC REACTIONS  . famotidine (PEPCID) 10 MG tablet Take 10 mg by mouth 2 (two) times daily.  Marland Kitchen levothyroxine (SYNTHROID, LEVOTHROID) 25 MCG tablet Take 25 mcg by mouth  See admin instructions. 1 Daily except 2 on Thursday and Sunday  . metoprolol succinate (TOPROL-XL) 25 MG 24 hr tablet Take 1 tablet (25 mg total) by mouth daily.  . Multiple Vitamin (MULTI-VITAMIN) tablet Take 1 tablet by mouth daily.  . nitroGLYCERIN (NITROSTAT) 0.4 MG SL tablet Place 1 tablet (0.4 mg total) under the tongue every 5 (five) minutes as needed.  . pravastatin (PRAVACHOL) 40 MG tablet TAKE ONE TABLET BY MOUTH EVERY EVENING     Allergies:   Protamine   Social History   Socioeconomic History  . Marital status: Married    Spouse name: Not on file  . Number of children: Not on file  . Years of education: Not on file  . Highest education level: Not on file    Occupational History  . Not on file  Tobacco Use  . Smoking status: Former Smoker    Packs/day: 1.00    Years: 25.00    Pack years: 25.00    Types: Cigarettes    Quit date: 05/09/1983    Years since quitting: 36.9  . Smokeless tobacco: Never Used  Vaping Use  . Vaping Use: Never used  Substance and Sexual Activity  . Alcohol use: No  . Drug use: No  . Sexual activity: Yes  Other Topics Concern  . Not on file  Social History Narrative  . Not on file   Social Determinants of Health   Financial Resource Strain:   . Difficulty of Paying Living Expenses:   Food Insecurity:   . Worried About Charity fundraiser in the Last Year:   . Arboriculturist in the Last Year:   Transportation Needs:   . Film/video editor (Medical):   Marland Kitchen Lack of Transportation (Non-Medical):   Physical Activity:   . Days of Exercise per Week:   . Minutes of Exercise per Session:   Stress:   . Feeling of Stress :   Social Connections:   . Frequency of Communication with Friends and Family:   . Frequency of Social Gatherings with Friends and Family:   . Attends Religious Services:   . Active Member of Clubs or Organizations:   . Attends Archivist Meetings:   Marland Kitchen Marital Status:      Family History: The patient's family history includes Cancer in his sister; Heart attack in his mother; Prostate cancer in his brother; Stroke in his brother and father. There is no history of Allergic rhinitis, Angioedema, Asthma, Atopy, Eczema, or Immunodeficiency. ROS:   Please see the history of present illness.    All 14 point review of systems negative except as described per history of present illness  EKGs/Labs/Other Studies Reviewed:      Recent Labs: 08/03/2019: BUN 12; Creatinine, Ser 1.19; Potassium 4.7; Sodium 141  Recent Lipid Panel    Component Value Date/Time   CHOL 155 08/03/2019 0825   TRIG 168 (H) 08/03/2019 0825   HDL 65 08/03/2019 0825   CHOLHDL 2.4 08/03/2019 0825   LDLCALC  62 08/03/2019 0825    Physical Exam:    VS:  BP (!) 162/76 (BP Location: Right Arm, Patient Position: Sitting, Cuff Size: Normal)   Pulse 75   Ht 5\' 7"  (1.702 m)   Wt 199 lb 6.4 oz (90.4 kg)   SpO2 97%   BMI 31.23 kg/m     Wt Readings from Last 3 Encounters:  03/29/20 199 lb 6.4 oz (90.4 kg)  07/21/19 199 lb 12.8 oz (90.6 kg)  11/12/18 200 lb 9.6 oz (91 kg)     GEN:  Well nourished, well developed in no acute distress HEENT: Normal NECK: No JVD; No carotid bruits LYMPHATICS: No lymphadenopathy CARDIAC: RRR, no murmurs, no rubs, no gallops RESPIRATORY:  Clear to auscultation without rales, wheezing or rhonchi  ABDOMEN: Soft, non-tender, non-distended MUSCULOSKELETAL:  No edema; No deformity  SKIN: Warm and dry LOWER EXTREMITIES: no swelling NEUROLOGIC:  Alert and oriented x 3 PSYCHIATRIC:  Normal affect   ASSESSMENT:    1. PVD (peripheral vascular disease) ([place])   2. Nonrheumatic aortic valve stenosis   3. CLL (chronic lymphocytic leukemia) ([place])   4. Status post coronary artery bypass graft    PLAN:    In order of problems listed above:  1. Peripheral vascular disease.  He does have carotic arterial disease last assessment more than 6 months ago with up to 79% stenosis.  Likely no TIA/CVA symptoms.  But this time to repeat his carotic ultrasounds which I will do. 2. Aortic stenosis: We discussed symptoms and signs of significant aortic stenosis which is shortness of breath chest pain dizziness.  He does not have any of this.  We will schedule him to have echocardiogram in September of this year. 3. CLL followed by hematology team. 4. Coronary artery disease status post coronary artery bypass graft.  Stable on appropriate medication which I will continue. 5. Dyslipidemia: I did review his K PN I have results of his fasting lipid profile from November 2020 showing HDL 65 LDL 61.  He is taking pravastatin 40 mg and Zetia 10 we will continue with this management.  He is  scheduled to see his primary care physician next month to have annual and he will have fasting lipid profile done. 6. Essential hypertension seems to be uncontrolled 162/76, however, he tells me anytime he comes to doctor's office blood pressure is elevated at home is usually 034V to 425Z systolic.   Medication Adjustments/Labs and Tests Ordered: Current medicines are reviewed at length with the patient today.  Concerns regarding medicines are outlined above.  No orders of the defined types were placed in this encounter.  Medication changes: No orders of the defined types were placed in this encounter.   Signed, Park Liter, MD, [facility] 03/29/2020 8:33 AM    [place]

## 2020-04-08 ENCOUNTER — Ambulatory Visit (INDEPENDENT_AMBULATORY_CARE_PROVIDER_SITE_OTHER): Payer: Medicare Other

## 2020-04-08 ENCOUNTER — Other Ambulatory Visit: Payer: Self-pay

## 2020-04-08 DIAGNOSIS — I739 Peripheral vascular disease, unspecified: Secondary | ICD-10-CM

## 2020-04-08 DIAGNOSIS — I6523 Occlusion and stenosis of bilateral carotid arteries: Secondary | ICD-10-CM | POA: Diagnosis not present

## 2020-04-08 NOTE — Progress Notes (Signed)
Carotid duplex exam performed  Jimmy Vicky Schleich RDCS, RVT 

## 2020-04-13 ENCOUNTER — Telehealth: Payer: Self-pay | Admitting: Cardiology

## 2020-04-13 NOTE — Telephone Encounter (Signed)
Patient left message with answering stating "why haven't I received test results form last Friday is this your way of getting g rid of him as a patient?"

## 2020-04-13 NOTE — Telephone Encounter (Signed)
Patient informed of results.  

## 2020-04-13 NOTE — Telephone Encounter (Signed)
His carotic ultrasound showed up to 80% both sides.  This is for medical therapy only.

## 2020-04-25 DIAGNOSIS — I10 Essential (primary) hypertension: Secondary | ICD-10-CM | POA: Diagnosis not present

## 2020-04-25 DIAGNOSIS — I2510 Atherosclerotic heart disease of native coronary artery without angina pectoris: Secondary | ICD-10-CM | POA: Diagnosis not present

## 2020-04-25 DIAGNOSIS — Z79899 Other long term (current) drug therapy: Secondary | ICD-10-CM | POA: Diagnosis not present

## 2020-04-25 DIAGNOSIS — Z951 Presence of aortocoronary bypass graft: Secondary | ICD-10-CM | POA: Diagnosis not present

## 2020-04-25 DIAGNOSIS — E785 Hyperlipidemia, unspecified: Secondary | ICD-10-CM | POA: Diagnosis not present

## 2020-04-25 DIAGNOSIS — E039 Hypothyroidism, unspecified: Secondary | ICD-10-CM | POA: Diagnosis not present

## 2020-04-25 DIAGNOSIS — Z0000 Encounter for general adult medical examination without abnormal findings: Secondary | ICD-10-CM | POA: Diagnosis not present

## 2020-04-25 DIAGNOSIS — R739 Hyperglycemia, unspecified: Secondary | ICD-10-CM | POA: Diagnosis not present

## 2020-05-05 DIAGNOSIS — I10 Essential (primary) hypertension: Secondary | ICD-10-CM | POA: Diagnosis not present

## 2020-05-05 DIAGNOSIS — H6091 Unspecified otitis externa, right ear: Secondary | ICD-10-CM | POA: Diagnosis not present

## 2020-06-01 DIAGNOSIS — U071 COVID-19: Secondary | ICD-10-CM

## 2020-06-01 HISTORY — DX: COVID-19: U07.1

## 2020-06-11 ENCOUNTER — Other Ambulatory Visit: Payer: Self-pay | Admitting: Cardiology

## 2020-06-14 ENCOUNTER — Other Ambulatory Visit: Payer: Medicare Other

## 2020-07-05 ENCOUNTER — Other Ambulatory Visit: Payer: Self-pay

## 2020-07-05 ENCOUNTER — Ambulatory Visit (INDEPENDENT_AMBULATORY_CARE_PROVIDER_SITE_OTHER): Payer: Medicare Other

## 2020-07-05 DIAGNOSIS — I350 Nonrheumatic aortic (valve) stenosis: Secondary | ICD-10-CM | POA: Diagnosis not present

## 2020-07-05 LAB — ECHOCARDIOGRAM COMPLETE
AR max vel: 0.93 cm2
AV Area VTI: 0.79 cm2
AV Area mean vel: 0.9 cm2
AV Mean grad: 23 mmHg
AV Peak grad: 37.5 mmHg
Ao pk vel: 3.06 m/s
Area-P 1/2: 3.6 cm2
S' Lateral: 3.4 cm

## 2020-07-05 NOTE — Progress Notes (Signed)
Complete echocardiogram performed.  Jimmy Camron Monday RDCS, RVT  

## 2020-07-11 ENCOUNTER — Telehealth: Payer: Self-pay | Admitting: Cardiology

## 2020-07-11 NOTE — Telephone Encounter (Signed)
Called patient informed him of results.

## 2020-07-11 NOTE — Telephone Encounter (Signed)
Patient would like a Nurse to go over the results from his recent Echocardiogram. He had it done last week but has not heard about the results.

## 2020-09-06 DIAGNOSIS — L814 Other melanin hyperpigmentation: Secondary | ICD-10-CM | POA: Diagnosis not present

## 2020-09-06 DIAGNOSIS — L570 Actinic keratosis: Secondary | ICD-10-CM | POA: Diagnosis not present

## 2020-09-06 DIAGNOSIS — L821 Other seborrheic keratosis: Secondary | ICD-10-CM | POA: Diagnosis not present

## 2020-09-14 DIAGNOSIS — K219 Gastro-esophageal reflux disease without esophagitis: Secondary | ICD-10-CM | POA: Insufficient documentation

## 2020-09-14 DIAGNOSIS — K759 Inflammatory liver disease, unspecified: Secondary | ICD-10-CM | POA: Insufficient documentation

## 2020-09-14 DIAGNOSIS — K8020 Calculus of gallbladder without cholecystitis without obstruction: Secondary | ICD-10-CM | POA: Insufficient documentation

## 2020-09-14 DIAGNOSIS — M1990 Unspecified osteoarthritis, unspecified site: Secondary | ICD-10-CM | POA: Insufficient documentation

## 2020-09-14 DIAGNOSIS — I2510 Atherosclerotic heart disease of native coronary artery without angina pectoris: Secondary | ICD-10-CM | POA: Insufficient documentation

## 2020-09-14 DIAGNOSIS — E039 Hypothyroidism, unspecified: Secondary | ICD-10-CM | POA: Insufficient documentation

## 2020-09-14 DIAGNOSIS — R011 Cardiac murmur, unspecified: Secondary | ICD-10-CM | POA: Insufficient documentation

## 2020-09-14 DIAGNOSIS — E785 Hyperlipidemia, unspecified: Secondary | ICD-10-CM | POA: Insufficient documentation

## 2020-09-14 DIAGNOSIS — Z87442 Personal history of urinary calculi: Secondary | ICD-10-CM | POA: Insufficient documentation

## 2020-09-14 DIAGNOSIS — K449 Diaphragmatic hernia without obstruction or gangrene: Secondary | ICD-10-CM | POA: Insufficient documentation

## 2020-09-14 DIAGNOSIS — I10 Essential (primary) hypertension: Secondary | ICD-10-CM | POA: Insufficient documentation

## 2020-09-14 DIAGNOSIS — N281 Cyst of kidney, acquired: Secondary | ICD-10-CM | POA: Insufficient documentation

## 2020-09-14 DIAGNOSIS — L509 Urticaria, unspecified: Secondary | ICD-10-CM | POA: Insufficient documentation

## 2020-09-15 ENCOUNTER — Ambulatory Visit: Payer: Medicare Other | Admitting: Cardiology

## 2020-09-15 ENCOUNTER — Other Ambulatory Visit: Payer: Self-pay

## 2020-09-15 ENCOUNTER — Encounter: Payer: Self-pay | Admitting: Cardiology

## 2020-09-15 VITALS — BP 180/76 | HR 67 | Ht 67.0 in | Wt 201.0 lb

## 2020-09-15 DIAGNOSIS — I10 Essential (primary) hypertension: Secondary | ICD-10-CM | POA: Diagnosis not present

## 2020-09-15 DIAGNOSIS — C9110 Chronic lymphocytic leukemia of B-cell type not having achieved remission: Secondary | ICD-10-CM

## 2020-09-15 DIAGNOSIS — I350 Nonrheumatic aortic (valve) stenosis: Secondary | ICD-10-CM

## 2020-09-15 DIAGNOSIS — U071 COVID-19: Secondary | ICD-10-CM

## 2020-09-15 DIAGNOSIS — I739 Peripheral vascular disease, unspecified: Secondary | ICD-10-CM | POA: Diagnosis not present

## 2020-09-15 DIAGNOSIS — E782 Mixed hyperlipidemia: Secondary | ICD-10-CM

## 2020-09-15 NOTE — Progress Notes (Signed)
Cardiology Office Note:    Date:  09/15/2020   ID:  James Hammond, James Hammond Dec 21, 1940, MRN [id number]  PCP:  Street, [name], MD  Cardiologist:  Jenne Campus, MD    Referring MD: Street, [name], *   Chief Complaint  Patient presents with  . Follow-up  I did have Covid 2 months ago  History of Present Illness:    Meryl Ponder is a 79 y.o. male with past medical history significant for coronary artery disease, status post coronary artery bypass graft many years ago, CLL which appears to be stable, mild to moderate aortic stenosis based on echocardiogram from summer 2021, up to 79% stenosis of both carotic internal arteries based on ultrasound from July 2021.  Also dyslipidemia.  He comes today to my office for follow-up.  Overall doing well however about 2 months ago he suffered from Covid that slows him down tremendously.  He said he is not able to do as much as he was able to do before.  He is complaining of weakness and fatigue, likely look like he did not have pneumonia, denies have any cardiac complaints there is no chest pain tightness squeezing pressure burning chest no palpitations no dizziness no passing out.  Past Medical History:  Diagnosis Date  . Aortic stenosis 12/20/2017   Mild aortic stenosis on echo in March 2018 mean gradient of 15  . Arthritis    "left hip, lower back" (01/01/2018)  . Benign hypertension 12/14/2015  . CLL (chronic lymphocytic leukemia) ([place])    "never had tx for it" (01/01/2018)  . Coronary artery disease   . COVID 06/2020  . Effort angina ([place]) 01/01/2018  . Gallstone   . GERD (gastroesophageal reflux disease)   . Heart murmur    told recently that he has a slight murmur   . Hepatitis    as a child   . Hiatal hernia   . History of kidney stones   . HNP (herniated nucleus pulposus), lumbar 05/13/2015  . Hyperlipidemia   . Hypertension   . Hypothyroidism    doesn't need treatment at [facility] s point   . Kidney cysts   .  Mixed hyperlipidemia 12/14/2015   Overview:   Intolerant to statin  . Non-STEMI (non-ST elevated myocardial infarction) ([place]) 06/12/2016  . PVD (peripheral vascular disease) ([place]) 11/22/2016   Overview:   Up to 70% stenosis of carotid artery detected before bypass surgery  . Status post coronary artery bypass graft 06/12/2017   The artery bypass graft 2017 LIMA to LAD SVG to RCA SVG to obtuse marginal 3 SVG to diagonal 1: Look like it is occluded based on cardiac catheterization from April 2019  Stent to origin of SVG to obtuse marginal 3 implanted in April 2019.  Drug-eluting stent  . Subclinical hypothyroidism 12/14/2015  . Systolic murmur 7/78/2423   Overview:  2016: noted, declined ECHO  . Urticaria     Past Surgical History:  Procedure Laterality Date  . APPENDECTOMY     79 y.o.  . BACK SURGERY    . CARDIAC CATHETERIZATION  06/2016   "couldn't get thru so they did bypass OR"  . CORONARY ARTERY BYPASS GRAFT  06/2016   CABG X4  . CORONARY STENT INTERVENTION N/A 01/01/2018   Procedure: CORONARY STENT INTERVENTION;  Surgeon: Wellington Hampshire, MD;  Location: [place] [facility];  Service: Cardiovascular;  Laterality: N/A;  SVG to OM ostium  . INCISION AND DRAINAGE ABSCESS ANAL  1968  .  LUMBAR LAMINECTOMY/DECOMPRESSION MICRODISCECTOMY Right 05/13/2015   Procedure: Microdiscectomy - Lumbar three--four  Lumbar four-five - right;  Surgeon: Kary Kos, MD;  Location: [place] NEURO ORS;  Service: Neurosurgery;  Laterality: Right;  . PILONIDAL CYST EXCISION  1965   required 30 day hosp. due to  (infection) being in the [employer] & being in the [place]   . RIGHT/LEFT HEART CATH AND CORONARY/GRAFT ANGIOGRAPHY N/A 01/01/2018   Procedure: RIGHT/LEFT HEART CATH AND CORONARY/GRAFT ANGIOGRAPHY;  Surgeon: Wellington Hampshire, MD;  Location: [place] CV LAB;  Service: Cardiovascular;  Laterality: N/A;    Current Medications: Current Meds  Medication Sig  . aspirin 81 MG tablet Take 81 mg by mouth daily.  .  cetirizine (ZYRTEC) 10 MG tablet Take 1 tablet daily at bedtime  . clopidogrel (PLAVIX) 75 MG tablet TAKE ONE TABLET BY MOUTH ONCE DAILY WITH BREAKFAST  . ezetimibe (ZETIA) 10 MG tablet TAKE ONE TABLET BY MOUTH ONCE DAILY  . famotidine (PEPCID) 10 MG tablet Take 10 mg by mouth 2 (two) times daily.  Marland Kitchen levothyroxine (SYNTHROID, LEVOTHROID) 25 MCG tablet Take 25 mcg by mouth See admin instructions. 1 Daily except 2 on Thursday and Sunday  . metoprolol succinate (TOPROL-XL) 25 MG 24 hr tablet Take 1 tablet (25 mg total) by mouth daily.  . nitroGLYCERIN (NITROSTAT) 0.4 MG SL tablet Place 1 tablet (0.4 mg total) under the tongue every 5 (five) minutes as needed.  . pravastatin (PRAVACHOL) 40 MG tablet TAKE ONE TABLET BY MOUTH EVERY EVENING     Allergies:   Protamine   Social History   Socioeconomic History  . Marital status: Married    Spouse name: Not on file  . Number of children: Not on file  . Years of education: Not on file  . Highest education level: Not on file  Occupational History  . Not on file  Tobacco Use  . Smoking status: Former Smoker    Packs/day: 1.00    Years: 25.00    Pack years: 25.00    Types: Cigarettes    Quit date: 05/09/1983    Years since quitting: 37.3  . Smokeless tobacco: Never Used  Vaping Use  . Vaping Use: Never used  Substance and Sexual Activity  . Alcohol use: No  . Drug use: No  . Sexual activity: Yes  Other Topics Concern  . Not on file  Social History Narrative  . Not on file   Social Determinants of Health   Financial Resource Strain: Not on file  Food Insecurity: Not on file  Transportation Needs: Not on file  Physical Activity: Not on file  Stress: Not on file  Social Connections: Not on file     Family History: The patient's family history includes Cancer in his sister; Heart attack in his mother; Prostate cancer in his brother; Stroke in his brother and father. There is no history of Allergic rhinitis, Angioedema, Asthma, Atopy,  Eczema, or Immunodeficiency. ROS:   Please see the history of present illness.    All 14 point review of systems negative except as described per history of present illness  EKGs/Labs/Other Studies Reviewed:      Recent Labs: No results found for requested labs within last 8760 hours.  Recent Lipid Panel    Component Value Date/Time   CHOL 155 08/03/2019 0825   TRIG 168 (H) 08/03/2019 0825   HDL 65 08/03/2019 0825   CHOLHDL 2.4 08/03/2019 0825   LDLCALC 62 08/03/2019 0825    Physical Exam:  VS:  BP (!) 180/76 (BP Location: Right Arm, Patient Position: Sitting)   Pulse 67   Ht 5\' 7"  (1.702 m)   Wt 201 lb (91.2 kg)   SpO2 99%   BMI 31.48 kg/m     Wt Readings from Last 3 Encounters:  09/15/20 201 lb (91.2 kg)  03/29/20 199 lb 6.4 oz (90.4 kg)  07/21/19 199 lb 12.8 oz (90.6 kg)     GEN:  Well nourished, well developed in no acute distress HEENT: Normal NECK: No JVD; No carotid bruits LYMPHATICS: No lymphadenopathy CARDIAC: RRR, no murmurs, no rubs, no gallops RESPIRATORY:  Clear to auscultation without rales, wheezing or rhonchi  ABDOMEN: Soft, non-tender, non-distended MUSCULOSKELETAL:  No edema; No deformity  SKIN: Warm and dry LOWER EXTREMITIES: no swelling NEUROLOGIC:  Alert and oriented x 3 PSYCHIATRIC:  Normal affect   ASSESSMENT:    1. Benign hypertension   2. COVID   3. Nonrheumatic aortic valve stenosis   4. PVD (peripheral vascular disease) ([place])   5. Mixed hyperlipidemia   6. CLL (chronic lymphocytic leukemia) ([facility])    PLAN:    In order of problems listed above:  1. -Benign essential hypertension however his blood pressure is uncontrolled I think we dealing with whitecoat hypertension.  He does have a blood pressure monitor at home he check his blood pressure on the regular basis unusual numbers he sees 120/70.,  Therefore, I will not change any of his medication we will continue monitoring asked him to check his blood pressure on the regular  basis at home. 2. Aortic stenosis which is mild to moderate based on last echocardiogram from summer of this year.  Continue monitoring.  He will required another echocardiogram this year.  I warned him about potential signs and symptoms of significant aortic stenosis ask him to let me know if he develop those. 3. Peripheral vascular disease in form of bilateral up to 79% stenosis of both carotid arteries.  He will have another carotid ultrasound done in January. 4. Dyslipidemia: I did review his K PN which revealed his LDL of 63 HDL 59 this is from summer of this year.  His medication include moderate intensity statin and Zetia.  He did have tolerance difficulty with different statins.  He is only on pravastatin.  I will continue for that now.   Medication Adjustments/Labs and Tests Ordered: Current medicines are reviewed at length with the patient today.  Concerns regarding medicines are outlined above.  Orders Placed This Encounter  Procedures  . EKG 12-Lead  . VAS US CAROTID   Medication changes: No orders of the defined types were placed in this encounter.   Signed, Park Liter, MD, [facility] 09/15/2020 8:30 AM    [place]

## 2020-09-15 NOTE — Patient Instructions (Signed)
Medication Instructions:  Your physician recommends that you continue on your current medications as directed. Please refer to the Current Medication list given to you today.  *If you need a refill on your cardiac medications before your next appointment, please call your pharmacy*   Lab Work: None If you have labs (blood work) drawn today and your tests are completely normal, you will receive your results only by: MyChart Message (if you have MyChart) OR A paper copy in the mail If you have any lab test that is abnormal or we need to change your treatment, we will call you to review the results.   Testing/Procedures: Your physician has requested that you have a carotid duplex. This test is an ultrasound of the carotid arteries in your neck. It looks at blood flow through these arteries that supply the brain with blood. Allow one hour for this exam. There are no restrictions or special instructions.    Follow-Up: At [facility], you and your health needs are our priority.  As part of our continuing mission to provide you with exceptional heart care, we have created designated Provider Care Teams.  These Care Teams include your primary Cardiologist (physician) and Advanced Practice Providers (APPs -  Physician Assistants and Nurse Practitioners) who all work together to provide you with the care you need, when you need it.  We recommend signing up for the patient portal called "MyChart".  Sign up information is provided on this After Visit Summary.  MyChart is used to connect with patients for Virtual Visits (Telemedicine).  Patients are able to view lab/test results, encounter notes, upcoming appointments, etc.  Non-urgent messages can be sent to your provider as well.   To learn more about what you can do with MyChart, go to [email or web address].    Your next appointment:   6 month(s)  The format for your next appointment:   In Person  Provider:   Robert Krasowski,  MD   Other Instructions   

## 2020-09-22 ENCOUNTER — Other Ambulatory Visit: Payer: Self-pay | Admitting: Cardiology

## 2020-10-24 ENCOUNTER — Other Ambulatory Visit: Payer: Self-pay

## 2020-10-24 ENCOUNTER — Ambulatory Visit (INDEPENDENT_AMBULATORY_CARE_PROVIDER_SITE_OTHER): Payer: Medicare Other

## 2020-10-24 DIAGNOSIS — I350 Nonrheumatic aortic (valve) stenosis: Secondary | ICD-10-CM

## 2020-10-24 DIAGNOSIS — E782 Mixed hyperlipidemia: Secondary | ICD-10-CM

## 2020-10-24 DIAGNOSIS — I10 Essential (primary) hypertension: Secondary | ICD-10-CM

## 2020-10-24 DIAGNOSIS — I6523 Occlusion and stenosis of bilateral carotid arteries: Secondary | ICD-10-CM | POA: Diagnosis not present

## 2020-10-24 DIAGNOSIS — I739 Peripheral vascular disease, unspecified: Secondary | ICD-10-CM

## 2020-10-24 NOTE — Progress Notes (Signed)
Carotid duplex exam performed  Jimmy Kynsie Falkner RDCS, RVT 

## 2020-11-08 ENCOUNTER — Inpatient Hospital Stay: Payer: Medicare Other | Admitting: Hematology and Oncology

## 2020-11-08 ENCOUNTER — Encounter: Payer: Self-pay | Admitting: Hematology and Oncology

## 2020-11-08 ENCOUNTER — Other Ambulatory Visit: Payer: Self-pay | Admitting: Hematology and Oncology

## 2020-11-08 ENCOUNTER — Telehealth: Payer: Self-pay | Admitting: Oncology

## 2020-11-08 ENCOUNTER — Other Ambulatory Visit: Payer: Self-pay

## 2020-11-08 ENCOUNTER — Inpatient Hospital Stay: Payer: Medicare Other | Attending: Hematology and Oncology

## 2020-11-08 VITALS — BP 181/79 | HR 65 | Temp 98.1°F | Resp 18 | Ht 67.0 in | Wt 200.8 lb

## 2020-11-08 DIAGNOSIS — C9110 Chronic lymphocytic leukemia of B-cell type not having achieved remission: Secondary | ICD-10-CM | POA: Diagnosis not present

## 2020-11-08 DIAGNOSIS — D649 Anemia, unspecified: Secondary | ICD-10-CM | POA: Diagnosis not present

## 2020-11-08 DIAGNOSIS — D47Z9 Other specified neoplasms of uncertain behavior of lymphoid, hematopoietic and related tissue: Secondary | ICD-10-CM | POA: Diagnosis not present

## 2020-11-08 DIAGNOSIS — D509 Iron deficiency anemia, unspecified: Secondary | ICD-10-CM

## 2020-11-08 LAB — CBC
Absolute Lymphocytes: 4.62 (ref 0.65–4.75)
MCV: 89 (ref 80–94)
RBC: 4.63 (ref 3.87–5.11)
RBC: 4.63 (ref 3.87–5.11)

## 2020-11-08 LAB — BASIC METABOLIC PANEL
BUN: 17 (ref 4–21)
CO2: 26 — AB (ref 13–22)
Chloride: 103 (ref 99–108)
Creatinine: 1.1 (ref 0.6–1.3)
Glucose: 109
Potassium: 4.3 (ref 3.4–5.3)
Sodium: 135 — AB (ref 137–147)

## 2020-11-08 LAB — HEPATIC FUNCTION PANEL
ALT: 18 (ref 10–40)
AST: 26 (ref 14–40)
Alkaline Phosphatase: 58 (ref 25–125)
Bilirubin, Total: 1

## 2020-11-08 LAB — CBC AND DIFFERENTIAL
HCT: 41 (ref 41–53)
Hemoglobin: 14 (ref 13.5–17.5)
Neutrophils Absolute: 2.43
Neutrophils Absolute: 42.43
Platelets: 174 (ref 150–399)
WBC: 8.1
WBC: 8.1

## 2020-11-08 LAB — IRON,TIBC AND FERRITIN PANEL
%SAT: 28.5
Ferritin: 35.8
Iron: 107
TIBC: 375

## 2020-11-08 LAB — COMPREHENSIVE METABOLIC PANEL
Albumin: 4.3 (ref 3.5–5.0)
Calcium: 9.2 (ref 8.7–10.7)

## 2020-11-08 NOTE — Progress Notes (Signed)
[facility]  [address] [place],  [place]  [address] [phone]  Clinic Day:  11/08/2020  Referring physician: Emmaline Kluver, *   CHIEF COMPLAINT:  CC: A 80 year old male with a history of B-cell lymphoproliferative disorder, whose immunophenotypic profile resembles CLL here for annual examination.  Current Treatment:  Surveillance   HISTORY OF PRESENT ILLNESS:  James Hammond is a 80 y.o. male with a history of  B-cell lymphoproliferative disorder, whose immunophenotypic profile resembles CLL.  This was diagnosed per flow cytometry of his peripheral blood in July 2018.  However, as there were not 5000 clonal white cells seen per his flow cytometry, he could not be officially given the diagnosis of chronic lymphocytic leukemia.  INTERVAL HISTORY:  James Hammond is here today for routine follow up of his B-cell lymphoproliferative disorder. He has been well since his last visit. He does report having Covid last September, but appears to have recovered well. He denies fever, chills, nausea or vomiting. He denies shortness of breath, chest pain or cough.His appetite is good and his weight is stable.  He denies issue with bowel or bladder. He denies any B symptoms such as itching or night sweats. CBC, CMP and iron studies today are unremarkable. Lymphocytes remain slightly elevated, but are decreased since last visit.   REVIEW OF SYSTEMS:  Review of Systems  Constitutional: Negative for appetite change, chills, diaphoresis, fatigue, fever and unexpected weight change.  HENT:   Negative for hearing loss, lump/mass, mouth sores, nosebleeds, sore throat, tinnitus, trouble swallowing and voice change.   Eyes: Negative for eye problems and icterus.  Respiratory: Negative for chest tightness, cough, hemoptysis, shortness of breath and wheezing.   Cardiovascular: Negative for chest pain, leg swelling and palpitations.  Gastrointestinal: Negative for  abdominal distention, abdominal pain, blood in stool, constipation, diarrhea, nausea, rectal pain and vomiting.  Endocrine: Negative for hot flashes.  Genitourinary: Negative for bladder incontinence, difficulty urinating, dyspareunia, dysuria, frequency, hematuria and nocturia.   Musculoskeletal: Negative for arthralgias, back pain, flank pain, gait problem, myalgias, neck pain and neck stiffness.  Skin: Negative for itching, rash and wound.  Neurological: Negative for dizziness, extremity weakness, gait problem, headaches, light-headedness, numbness, seizures and speech difficulty.  Hematological: Negative for adenopathy. Does not bruise/bleed easily.  Psychiatric/Behavioral: Negative for confusion, decreased concentration, depression, sleep disturbance and suicidal ideas. The patient is not nervous/anxious.      VITALS:  Blood pressure (!) 181/79, pulse 65, temperature 98.1 F (36.7 C), temperature source Oral, resp. rate 18, height 5\' 7"  (1.702 m), weight 200 lb 12.8 oz (91.1 kg), SpO2 98 %.  Wt Readings from Last 3 Encounters:  11/08/20 200 lb 12.8 oz (91.1 kg)  09/15/20 201 lb (91.2 kg)  03/29/20 199 lb 6.4 oz (90.4 kg)    Body mass index is 31.45 kg/m.  Performance status (ECOG): 0 - Asymptomatic  PHYSICAL EXAM:  Physical Exam Constitutional:      General: He is not in acute distress.    Appearance: Normal appearance. He is normal weight. He is not ill-appearing, toxic-appearing or diaphoretic.  HENT:     Head: Normocephalic and atraumatic.     Right Ear: Tympanic membrane normal.     Left Ear: Tympanic membrane normal.     Nose: Nose normal. No congestion or rhinorrhea.     Mouth/Throat:     Mouth: Mucous membranes are moist.     Pharynx: Oropharynx is clear. No oropharyngeal exudate or posterior oropharyngeal erythema.  Eyes:     General: No scleral icterus.       Right eye: No discharge.        Left eye: No discharge.     Extraocular Movements: Extraocular movements  intact.     Conjunctiva/sclera: Conjunctivae normal.     Pupils: Pupils are equal, round, and reactive to light.  Neck:     Vascular: No carotid bruit.  Cardiovascular:     Rate and Rhythm: Normal rate and regular rhythm.     Heart sounds: No murmur heard. No friction rub. No gallop.   Pulmonary:     Effort: Pulmonary effort is normal. No respiratory distress.     Breath sounds: Normal breath sounds. No stridor. No wheezing, rhonchi or rales.  Chest:     Chest wall: No tenderness.  Abdominal:     General: Abdomen is flat. Bowel sounds are normal. There is no distension.     Palpations: There is no mass.     Tenderness: There is no abdominal tenderness. There is no right CVA tenderness, left CVA tenderness, guarding or rebound.     Hernia: No hernia is present.  Musculoskeletal:        General: No swelling, tenderness, deformity or signs of injury. Normal range of motion.     Cervical back: Normal range of motion and neck supple. No rigidity or tenderness.     Right lower leg: No edema.     Left lower leg: No edema.  Lymphadenopathy:     Cervical: No cervical adenopathy.  Skin:    General: Skin is warm and dry.     Capillary Refill: Capillary refill takes less than 2 seconds.     Coloration: Skin is not jaundiced or pale.     Findings: No bruising, erythema, lesion or rash.  Neurological:     General: No focal deficit present.     Mental Status: He is alert and oriented to person, place, and time. Mental status is at baseline.     Cranial Nerves: No cranial nerve deficit.     Sensory: No sensory deficit.     Motor: No weakness.     Coordination: Coordination normal.     Gait: Gait normal.     Deep Tendon Reflexes: Reflexes normal.  Psychiatric:        Mood and Affect: Mood normal.        Behavior: Behavior normal.        Thought Content: Thought content normal.        Judgment: Judgment normal.     LABS:   CBC Latest Ref Rng & Units 11/08/2020 11/08/2020 01/02/2018  WBC -  8.1 8.1 8.8  Hemoglobin 13.5 - 17.5 - 14.0 12.4(L)  Hematocrit 41 - 53 - 41 37.3(L)  Platelets 150 - 399 - 174 182   CMP Latest Ref Rng & Units 11/08/2020 08/03/2019 11/12/2018  Glucose 65 - 99 mg/dL - 99 -  BUN 4 - 21 17 12  -  Creatinine 0.6 - 1.3 1.1 1.19 -  Sodium 137 - 147 135(A) 141 -  Potassium 3.4 - 5.3 4.3 4.7 -  Chloride 99 - 108 103 103 -  CO2 13 - 22 26(A) 23 -  Calcium 8.7 - 10.7 9.2 9.9 -  Total Protein 6.0 - 8.5 g/dL - - 6.2  Total Bilirubin 0.0 - 1.2 mg/dL - - 0.9  Alkaline Phos 25 - 125 58 - 71  AST 14 - 40 26 - 17  ALT 10 -  51 18 - 17     No results found for: CEA1 / No results found for: CEA1 No results found for: PSA1 No results found for: CAN199 No results found for: RCV893  Lab Results  Component Value Date   ALBUMINELP 3.9 04/04/2017   MSPIKE Not Observed 04/04/2017   Lab Results  Component Value Date   TIBC 375 11/08/2020   FERRITIN 35.8 11/08/2020   IRONPCTSAT 28.5 11/08/2020   No results found for: LDH  STUDIES:      HISTORY:   Past Medical History:  Diagnosis Date   Aortic stenosis 12/20/2017   Mild aortic stenosis on echo in March 2018 mean gradient of 15   Arthritis    "left hip, lower back" (01/01/2018)   Benign hypertension 12/14/2015   CLL (chronic lymphocytic leukemia) ([place])    "never had tx for it" (01/01/2018)   Coronary artery disease    COVID 06/2020   Effort angina ([place]) 01/01/2018   Gallstone    GERD (gastroesophageal reflux disease)    Heart murmur    told recently that he has a slight murmur    Hepatitis    as a child    Hiatal hernia    History of kidney stones    HNP (herniated nucleus pulposus), lumbar 05/13/2015   Hyperlipidemia    Hypertension    Hypothyroidism    doesn't need treatment at thi s point    Kidney cysts    Mixed hyperlipidemia 12/14/2015   Overview:   Intolerant to statin   Non-STEMI (non-ST elevated myocardial infarction) ([place]) 06/12/2016   PVD (peripheral vascular disease)  ([place]) 11/22/2016   Overview:   Up to 70% stenosis of carotid artery detected before bypass surgery   Status post coronary artery bypass graft 06/12/2017   The artery bypass graft 2017 LIMA to LAD SVG to RCA SVG to obtuse marginal 3 SVG to diagonal 1: Look like it is occluded based on cardiac catheterization from April 2019  Stent to origin of SVG to obtuse marginal 3 implanted in April 2019.  Drug-eluting stent   Subclinical hypothyroidism 05/10/1750   Systolic murmur 0/25/8527   Overview:  2016: noted, declined ECHO   Urticaria     Past Surgical History:  Procedure Laterality Date   APPENDECTOMY     80 y.o.   BACK SURGERY     CARDIAC CATHETERIZATION  06/2016   "couldn't get thru so they did bypass OR"   CORONARY ARTERY BYPASS GRAFT  06/2016   CABG X4   CORONARY STENT INTERVENTION N/A 01/01/2018   Procedure: CORONARY STENT INTERVENTION;  Surgeon: Wellington Hampshire, MD;  Location: [place] CV LAB;  Service: Cardiovascular;  Laterality: N/A;  SVG to OM ostium   INCISION AND DRAINAGE ABSCESS ANAL  1968   LUMBAR LAMINECTOMY/DECOMPRESSION MICRODISCECTOMY Right 05/13/2015   Procedure: Microdiscectomy - Lumbar three--four  Lumbar four-five - right;  Surgeon: Kary Kos, MD;  Location: [place] NEURO ORS;  Service: Neurosurgery;  Laterality: Right;   PILONIDAL CYST EXCISION  1965   required 30 day hosp. due to  (infection) being in the [place] & being in the wilderness    RIGHT/LEFT HEART CATH AND CORONARY/GRAFT ANGIOGRAPHY N/A 01/01/2018   Procedure: RIGHT/LEFT HEART CATH AND CORONARY/GRAFT ANGIOGRAPHY;  Surgeon: Wellington Hampshire, MD;  Location: [place] CV LAB;  Service: Cardiovascular;  Laterality: N/A;    Family History  Problem Relation Age of Onset   Stroke Father    Heart attack Mother    Cancer  Sister    Stroke Brother    Prostate cancer Brother    Allergic rhinitis Neg Hx    Angioedema Neg Hx    Asthma Neg Hx    Atopy Neg Hx    Eczema Neg Hx     Immunodeficiency Neg Hx     Social History:  reports that he quit smoking about 37 years ago. His smoking use included cigarettes. He has a 25.00 pack-year smoking history. He has never used smokeless tobacco. He reports that he does not drink alcohol and does not use drugs.The patient is alone  today.  Allergies:  Allergies  Allergen Reactions   Protamine Anaphylaxis    Current Medications: Current Outpatient Medications  Medication Sig Dispense Refill   aspirin 81 MG tablet Take 81 mg by mouth daily.     cetirizine (ZYRTEC) 10 MG tablet Take 1 tablet daily at bedtime     clopidogrel (PLAVIX) 75 MG tablet TAKE ONE TABLET BY MOUTH ONCE DAILY WITH BREAKFAST 90 tablet 2   ezetimibe (ZETIA) 10 MG tablet TAKE ONE TABLET BY MOUTH ONCE DAILY 90 tablet 2   famotidine (PEPCID) 10 MG tablet Take 10 mg by mouth 2 (two) times daily.     levothyroxine (SYNTHROID, LEVOTHROID) 25 MCG tablet Take 25 mcg by mouth See admin instructions. 1 Daily except 2 on Thursday and Sunday     metoprolol succinate (TOPROL-XL) 25 MG 24 hr tablet Take 1 tablet (25 mg total) by mouth daily. 90 tablet 1   nitroGLYCERIN (NITROSTAT) 0.4 MG SL tablet Place 1 tablet (0.4 mg total) under the tongue every 5 (five) minutes as needed. 25 tablet 4   pravastatin (PRAVACHOL) 40 MG tablet TAKE ONE TABLET BY MOUTH EVERY EVENING 90 tablet 2   No current facility-administered medications for this visit.     ASSESSMENT & PLAN:   Assessment:  James Hammond is a 80 y.o. male with a history of B-cell lymphoproliferative disorder.  When evaluating his labs today, this gentleman's white count is only minimally elevated.  He still has a predominance of lymphocytes, which highlights that his B-cell lymphoproliferative disorder is still present.  Nevertheless, this disorder remains quiescent.  With respect to his history of iron deficiency anemia, this gentleman's hemoglobin and iron parameters remain normal.  From a  hematologic standpoint, this patient is doing very well.  Plan: He will return to clinic in one year for repeat CBC, CMP, iron studies and evaluation.    The patient understands the plans discussed today and is in agreement with them.  He knows to contact our office if he develops concerns prior to his next appointment.      Melodye Ped, NP

## 2020-11-08 NOTE — Telephone Encounter (Signed)
Per 2/8 los next appt sched and given to patient 

## 2020-12-08 ENCOUNTER — Other Ambulatory Visit: Payer: Self-pay | Admitting: Cardiology

## 2021-03-16 ENCOUNTER — Ambulatory Visit: Payer: Medicare Other | Admitting: Cardiology

## 2021-05-18 DIAGNOSIS — Z0000 Encounter for general adult medical examination without abnormal findings: Secondary | ICD-10-CM | POA: Diagnosis not present

## 2021-05-18 DIAGNOSIS — Z79899 Other long term (current) drug therapy: Secondary | ICD-10-CM | POA: Diagnosis not present

## 2021-05-18 DIAGNOSIS — Z13220 Encounter for screening for lipoid disorders: Secondary | ICD-10-CM | POA: Diagnosis not present

## 2021-05-18 DIAGNOSIS — E559 Vitamin D deficiency, unspecified: Secondary | ICD-10-CM | POA: Diagnosis not present

## 2021-05-23 ENCOUNTER — Other Ambulatory Visit: Payer: Self-pay

## 2021-05-23 ENCOUNTER — Encounter: Payer: Self-pay | Admitting: Cardiology

## 2021-05-23 ENCOUNTER — Ambulatory Visit: Payer: Medicare Other | Admitting: Cardiology

## 2021-05-23 VITALS — BP 174/68 | HR 75 | Ht 67.0 in | Wt 197.6 lb

## 2021-05-23 DIAGNOSIS — E782 Mixed hyperlipidemia: Secondary | ICD-10-CM | POA: Diagnosis not present

## 2021-05-23 DIAGNOSIS — I739 Peripheral vascular disease, unspecified: Secondary | ICD-10-CM | POA: Diagnosis not present

## 2021-05-23 DIAGNOSIS — Z951 Presence of aortocoronary bypass graft: Secondary | ICD-10-CM | POA: Diagnosis not present

## 2021-05-23 DIAGNOSIS — I350 Nonrheumatic aortic (valve) stenosis: Secondary | ICD-10-CM | POA: Diagnosis not present

## 2021-05-23 DIAGNOSIS — C9110 Chronic lymphocytic leukemia of B-cell type not having achieved remission: Secondary | ICD-10-CM | POA: Diagnosis not present

## 2021-05-23 NOTE — Patient Instructions (Signed)
Medication Instructions:  Your physician recommends that you continue on your current medications as directed. Please refer to the Current Medication list given to you today.  *If you need a refill on your cardiac medications before your next appointment, please call your pharmacy*   Lab Work: NONE If you have labs (blood work) drawn today and your tests are completely normal, you will receive your results only by: [place] (if you have MyChart) OR A paper copy in the mail If you have any lab test that is abnormal or we need to change your treatment, we will call you to review the results.   Testing/Procedures: Your physician has requested that you have a carotid duplex. This test is an ultrasound of the carotid arteries in your neck. It looks at blood flow through these arteries that supply the brain with blood. Allow one hour for this exam. There are no restrictions or special instructions.    Follow-Up: At [facility], you and your health needs are our priority.  As part of our continuing mission to provide you with exceptional heart care, we have created designated Provider Care Teams.  These Care Teams include your primary Cardiologist (physician) and Advanced Practice Providers (APPs -  Physician Assistants and Nurse Practitioners) who all work together to provide you with the care you need, when you need it.  We recommend signing up for the patient portal called "MyChart".  Sign up information is provided on this After Visit Summary.  MyChart is used to connect with patients for Virtual Visits (Telemedicine).  Patients are able to view lab/test results, encounter notes, upcoming appointments, etc.  Non-urgent messages can be sent to your provider as well.   To learn more about what you can do with MyChart, go to [email or web address].    Your next appointment:   6 month(s)  The format for your next appointment:   In Person  Provider:   Jenne Campus,  MD   Other Instructions

## 2021-05-23 NOTE — Addendum Note (Signed)
Addended by: Jerl Santos R on: 05/23/2021 03:04 PM   Modules accepted: Orders

## 2021-05-23 NOTE — Progress Notes (Signed)
Cardiology Office Note:    Date:  05/23/2021   ID:  James Hammond, James Hammond 09-Dec-1940, MRN [id number]  PCP:  Street, [name], MD  Cardiologist:  Jenne Campus, MD    Referring MD: Street, [name], *   Chief Complaint  Patient presents with   Follow-up  Doing well  History of Present Illness:    James Hammond is a 80 y.o. male   with past medical history significant for coronary artery disease, status post coronary artery bypass graft many years ago, CLL which appears to be stable, mild to moderate aortic stenosis based on echocardiogram from summer 2021, up to 79% stenosis of both carotic internal arteries based on ultrasound from January 2022.  Also dyslipidemia He comes today to my office for follow-up.  Overall he is doing great.  He is asymptomatic.  Denies have any chest pain tightness squeezing pressure mid chest.  Still working in the garden have multiple tomato plants as well as doing some woodwork always busy always on the go.  No problems no chest pain tightness squeezing pressure burning chest no shortness of breath no passing out Past Medical History:  Diagnosis Date   Aortic stenosis 12/20/2017   Mild aortic stenosis on echo in March 2018 mean gradient of 15   Arthritis    "left hip, lower back" (01/01/2018)   Benign hypertension 12/14/2015   CLL (chronic lymphocytic leukemia) ([place])    "never had tx for it" (01/01/2018)   Coronary artery disease    COVID 06/2020   Effort angina ([place]) 01/01/2018   Gallstone    GERD (gastroesophageal reflux disease)    Heart murmur    told recently that he has a slight murmur    Hepatitis    as a child    Hiatal hernia    History of kidney stones    HNP (herniated nucleus pulposus), lumbar 05/13/2015   Hyperlipidemia    Hypertension    Hypothyroidism    doesn't need treatment at thi s point    Kidney cysts    Mixed hyperlipidemia 12/14/2015   Overview:   Intolerant to statin   Non-STEMI (non-ST elevated  myocardial infarction) ([place]) 06/12/2016   PVD (peripheral vascular disease) ([place]) 11/22/2016   Overview:   Up to 70% stenosis of carotid artery detected before bypass surgery   Status post coronary artery bypass graft 06/12/2017   The artery bypass graft 2017 LIMA to LAD SVG to RCA SVG to obtuse marginal 3 SVG to diagonal 1: Look like it is occluded based on cardiac catheterization from April 2019  Stent to origin of SVG to obtuse marginal 3 implanted in April 2019.  Drug-eluting stent   Subclinical hypothyroidism [id number]   Systolic murmur [id number]   Overview:  2016: noted, declined ECHO   Urticaria     Past Surgical History:  Procedure Laterality Date   APPENDECTOMY     80 y.o.   BACK SURGERY     CARDIAC CATHETERIZATION  06/2016   "couldn't get thru so they did bypass OR"   CORONARY ARTERY BYPASS GRAFT  06/2016   CABG X4   CORONARY STENT INTERVENTION N/A 01/01/2018   Procedure: CORONARY STENT INTERVENTION;  Surgeon: Wellington Hampshire, MD;  Location: [facility];  Service: Cardiovascular;  Laterality: N/A;  SVG to OM ostium   INCISION AND DRAINAGE ABSCESS ANAL  1968   LUMBAR LAMINECTOMY/DECOMPRESSION MICRODISCECTOMY Right 05/13/2015   Procedure: Microdiscectomy - Lumbar three--four  Lumbar four-five - right;  Surgeon: Kary Kos, MD;  Location: [facility];  Service: Neurosurgery;  Laterality: Right;   PILONIDAL CYST EXCISION  1965   required 30 day hosp. due to  (infection) being in the [place] the [place]    RIGHT/LEFT HEART CATH AND CORONARY/GRAFT ANGIOGRAPHY N/A 01/01/2018   Procedure: RIGHT/LEFT HEART CATH AND CORONARY/GRAFT ANGIOGRAPHY;  Surgeon: Wellington Hampshire, MD;  Location: [place] [facility];  Service: Cardiovascular;  Laterality: N/A;    Current Medications: Current Meds  Medication Sig   aspirin 81 MG tablet Take 81 mg by mouth daily.   cetirizine (ZYRTEC) 10 MG tablet Take 10 mg by mouth daily.   clopidogrel (PLAVIX) 75 MG tablet TAKE ONE TABLET BY  MOUTH ONCE DAILY WITH BREAKFAST (Patient taking differently: Take 75 mg by mouth daily.)   ezetimibe (ZETIA) 10 MG tablet TAKE ONE TABLET BY MOUTH ONCE DAILY (Patient taking differently: Take 10 mg by mouth daily.)   famotidine (PEPCID) 10 MG tablet Take 10 mg by mouth 2 (two) times daily.   levothyroxine (SYNTHROID, LEVOTHROID) 25 MCG tablet Take 25 mcg by mouth See admin instructions. 1 Daily except 2 on Thursday and Sunday   metoprolol succinate (TOPROL-XL) 25 MG 24 hr tablet Take 1 tablet (25 mg total) by mouth daily.   nitroGLYCERIN (NITROSTAT) 0.4 MG SL tablet Place 1 tablet (0.4 mg total) under the tongue every 5 (five) minutes as needed. (Patient taking differently: Place 0.4 mg under the tongue every 5 (five) minutes as needed for chest pain.)   pravastatin (PRAVACHOL) 40 MG tablet TAKE ONE TABLET BY MOUTH EVERY EVENING (Patient taking differently: Take 40 mg by mouth at bedtime.)     Allergies:   Protamine   Social History   Socioeconomic History   Marital status: Married    Spouse name: Not on file   Number of children: Not on file   Years of education: Not on file   Highest education level: Not on file  Occupational History   Not on file  Tobacco Use   Smoking status: Former    Packs/day: 1.00    Years: 25.00    Pack years: 25.00    Types: Cigarettes    Quit date: 05/09/1983    Years since quitting: 38.0   Smokeless tobacco: Never  Vaping Use   Vaping Use: Never used  Substance and Sexual Activity   Alcohol use: No   Drug use: No   Sexual activity: Yes  Other Topics Concern   Not on file  Social History Narrative   Not on file   Social Determinants of Health   Financial Resource Strain: Not on file  Food Insecurity: Not on file  Transportation Needs: Not on file  Physical Activity: Not on file  Stress: Not on file  Social Connections: Not on file     Family History: The patient's family history includes Cancer in his sister; Heart attack in his mother;  Prostate cancer in his brother; Stroke in his brother and father. There is no history of Allergic rhinitis, Angioedema, Asthma, Atopy, Eczema, or Immunodeficiency. ROS:   Please see the history of present illness.    All 14 point review of systems negative except as described per history of present illness  EKGs/Labs/Other Studies Reviewed:      Recent Labs: 11/08/2020: ALT 18; BUN 17; Creatinine 1.1; Hemoglobin 14.0; Platelets 174; Potassium 4.3; Sodium 135  Recent Lipid Panel    Component Value Date/Time   CHOL 155 08/03/2019 0825  TRIG 168 (H) 08/03/2019 0825   HDL 65 08/03/2019 0825   CHOLHDL 2.4 08/03/2019 0825   LDLCALC 62 08/03/2019 0825    Physical Exam:    VS:  BP (!) 174/68 (BP Location: Left Arm, Patient Position: Sitting)   Pulse 75   Ht '5\' 7"'$  (1.702 m)   Wt 197 lb 9.6 oz (89.6 kg)   SpO2 94%   BMI 30.95 kg/m     Wt Readings from Last 3 Encounters:  05/23/21 197 lb 9.6 oz (89.6 kg)  11/08/20 200 lb 12.8 oz (91.1 kg)  09/15/20 201 lb (91.2 kg)     GEN:  Well nourished, well developed in no acute distress HEENT: Normal NECK: No JVD; No carotid bruits LYMPHATICS: No lymphadenopathy CARDIAC: RRR, systolic ejection murmur grade 2/6 best heard right upper portion of the sternum, S2 is still strong, there is also holosystolic murmur best heard at the apex grade 2/6 with radiation towards axilla, no rubs, no gallops RESPIRATORY:  Clear to auscultation without rales, wheezing or rhonchi  ABDOMEN: Soft, non-tender, non-distended MUSCULOSKELETAL:  No edema; No deformity  SKIN: Warm and dry LOWER EXTREMITIES: no swelling NEUROLOGIC:  Alert and oriented x 3 PSYCHIATRIC:  Normal affect   ASSESSMENT:    1. Status post coronary artery bypass graft   2. Mixed hyperlipidemia   3. CLL (chronic lymphocytic leukemia) ([place])   4. Nonrheumatic aortic valve stenosis    PLAN:    In order of problems listed above:  Status post coronary artery bypass graft doing well from  that point review asymptomatic continue present management Dyslipidemia I did review K PN which show me LDL of 62 HDL 61 this is from 05/18/2021 he is on statin which I will continue.  He is taking pravastatin 40. CLL stable followed by oncology team. Nonrheumatic arctic valve stenosis last assessment mild to moderate continue monitoring.  I warned her about signs and symptoms of worsening of the problem asking to let me know if he develops those if not we will repeat another echocardiogram in a year or so   Medication Adjustments/Labs and Tests Ordered: Current medicines are reviewed at length with the patient today.  Concerns regarding medicines are outlined above.  No orders of the defined types were placed in this encounter.  Medication changes: No orders of the defined types were placed in this encounter.   Signed, Park Liter, MD, [facility] 05/23/2021 2:46 PM    [place]

## 2021-05-25 DIAGNOSIS — Z9889 Other specified postprocedural states: Secondary | ICD-10-CM | POA: Diagnosis not present

## 2021-05-25 DIAGNOSIS — I10 Essential (primary) hypertension: Secondary | ICD-10-CM | POA: Diagnosis not present

## 2021-05-25 DIAGNOSIS — Z0000 Encounter for general adult medical examination without abnormal findings: Secondary | ICD-10-CM | POA: Diagnosis not present

## 2021-05-25 DIAGNOSIS — E785 Hyperlipidemia, unspecified: Secondary | ICD-10-CM | POA: Diagnosis not present

## 2021-05-25 DIAGNOSIS — I252 Old myocardial infarction: Secondary | ICD-10-CM | POA: Diagnosis not present

## 2021-05-25 DIAGNOSIS — E039 Hypothyroidism, unspecified: Secondary | ICD-10-CM | POA: Diagnosis not present

## 2021-05-25 DIAGNOSIS — Z951 Presence of aortocoronary bypass graft: Secondary | ICD-10-CM | POA: Diagnosis not present

## 2021-05-25 DIAGNOSIS — Z955 Presence of coronary angioplasty implant and graft: Secondary | ICD-10-CM | POA: Diagnosis not present

## 2021-05-25 DIAGNOSIS — I25119 Atherosclerotic heart disease of native coronary artery with unspecified angina pectoris: Secondary | ICD-10-CM | POA: Diagnosis not present

## 2021-05-25 DIAGNOSIS — C9110 Chronic lymphocytic leukemia of B-cell type not having achieved remission: Secondary | ICD-10-CM | POA: Diagnosis not present

## 2021-05-25 DIAGNOSIS — R011 Cardiac murmur, unspecified: Secondary | ICD-10-CM | POA: Diagnosis not present

## 2021-05-25 DIAGNOSIS — J3089 Other allergic rhinitis: Secondary | ICD-10-CM | POA: Diagnosis not present

## 2021-06-07 ENCOUNTER — Ambulatory Visit (INDEPENDENT_AMBULATORY_CARE_PROVIDER_SITE_OTHER): Payer: Medicare Other

## 2021-06-07 ENCOUNTER — Other Ambulatory Visit: Payer: Self-pay

## 2021-06-07 DIAGNOSIS — I739 Peripheral vascular disease, unspecified: Secondary | ICD-10-CM | POA: Diagnosis not present

## 2021-06-07 DIAGNOSIS — I6523 Occlusion and stenosis of bilateral carotid arteries: Secondary | ICD-10-CM

## 2021-06-08 ENCOUNTER — Telehealth: Payer: Self-pay

## 2021-06-08 NOTE — Telephone Encounter (Signed)
Spoke with patient regarding results and recommendation.  Patient verbalizes understanding and is agreeable to plan of care. Advised patient to call back with any issues or concerns.  

## 2021-06-08 NOTE — Telephone Encounter (Signed)
-----   Message from Park Liter, MD sent at 06/08/2021  2:50 PM EDT ----- Up to 79% stenosis in the right internal carotic artery, medical therapy

## 2021-06-15 DIAGNOSIS — I25119 Atherosclerotic heart disease of native coronary artery with unspecified angina pectoris: Secondary | ICD-10-CM | POA: Diagnosis not present

## 2021-06-15 DIAGNOSIS — I440 Atrioventricular block, first degree: Secondary | ICD-10-CM | POA: Diagnosis not present

## 2021-06-15 DIAGNOSIS — C9110 Chronic lymphocytic leukemia of B-cell type not having achieved remission: Secondary | ICD-10-CM | POA: Diagnosis not present

## 2021-06-15 DIAGNOSIS — Z951 Presence of aortocoronary bypass graft: Secondary | ICD-10-CM | POA: Diagnosis not present

## 2021-06-15 DIAGNOSIS — I10 Essential (primary) hypertension: Secondary | ICD-10-CM | POA: Diagnosis not present

## 2021-06-15 DIAGNOSIS — I2510 Atherosclerotic heart disease of native coronary artery without angina pectoris: Secondary | ICD-10-CM | POA: Diagnosis not present

## 2021-06-15 DIAGNOSIS — Z7982 Long term (current) use of aspirin: Secondary | ICD-10-CM | POA: Diagnosis not present

## 2021-06-15 DIAGNOSIS — E782 Mixed hyperlipidemia: Secondary | ICD-10-CM | POA: Diagnosis not present

## 2021-06-16 ENCOUNTER — Telehealth (HOSPITAL_BASED_OUTPATIENT_CLINIC_OR_DEPARTMENT_OTHER): Payer: Self-pay

## 2021-06-16 ENCOUNTER — Telehealth: Payer: Self-pay | Admitting: Cardiology

## 2021-06-16 NOTE — Telephone Encounter (Signed)
Spoke to James Hammond just now and she let me know that someone already got this for her and she is good to go.

## 2021-06-16 NOTE — Telephone Encounter (Signed)
Image Request for powershare was not able to be fulfilled by [facility], as the patient's imaging was done at another facility [facility] - telephone [phone]] Fax sent to Antionette Poles with information.

## 2021-06-16 NOTE — Telephone Encounter (Signed)
    Lisa with [facility], she requesting to have carotid imaging push to power share so they can pull it up on their end

## 2021-07-04 DIAGNOSIS — Z951 Presence of aortocoronary bypass graft: Secondary | ICD-10-CM | POA: Diagnosis not present

## 2021-07-04 DIAGNOSIS — I10 Essential (primary) hypertension: Secondary | ICD-10-CM | POA: Diagnosis not present

## 2021-07-04 DIAGNOSIS — I2510 Atherosclerotic heart disease of native coronary artery without angina pectoris: Secondary | ICD-10-CM | POA: Diagnosis not present

## 2021-07-06 DIAGNOSIS — E782 Mixed hyperlipidemia: Secondary | ICD-10-CM | POA: Diagnosis not present

## 2021-07-06 DIAGNOSIS — I6523 Occlusion and stenosis of bilateral carotid arteries: Secondary | ICD-10-CM | POA: Diagnosis not present

## 2021-07-06 DIAGNOSIS — I2510 Atherosclerotic heart disease of native coronary artery without angina pectoris: Secondary | ICD-10-CM | POA: Diagnosis not present

## 2021-07-06 DIAGNOSIS — I10 Essential (primary) hypertension: Secondary | ICD-10-CM | POA: Diagnosis not present

## 2021-07-07 DIAGNOSIS — Z951 Presence of aortocoronary bypass graft: Secondary | ICD-10-CM | POA: Diagnosis not present

## 2021-07-07 DIAGNOSIS — R9439 Abnormal result of other cardiovascular function study: Secondary | ICD-10-CM | POA: Diagnosis not present

## 2021-07-07 DIAGNOSIS — Z7982 Long term (current) use of aspirin: Secondary | ICD-10-CM | POA: Diagnosis not present

## 2021-07-07 DIAGNOSIS — I2510 Atherosclerotic heart disease of native coronary artery without angina pectoris: Secondary | ICD-10-CM | POA: Diagnosis not present

## 2021-07-07 DIAGNOSIS — I350 Nonrheumatic aortic (valve) stenosis: Secondary | ICD-10-CM | POA: Diagnosis not present

## 2021-07-07 DIAGNOSIS — I10 Essential (primary) hypertension: Secondary | ICD-10-CM | POA: Diagnosis not present

## 2021-07-07 DIAGNOSIS — C9110 Chronic lymphocytic leukemia of B-cell type not having achieved remission: Secondary | ICD-10-CM | POA: Diagnosis not present

## 2021-07-07 DIAGNOSIS — E782 Mixed hyperlipidemia: Secondary | ICD-10-CM | POA: Diagnosis not present

## 2021-07-10 DIAGNOSIS — E785 Hyperlipidemia, unspecified: Secondary | ICD-10-CM | POA: Diagnosis not present

## 2021-07-10 DIAGNOSIS — R9439 Abnormal result of other cardiovascular function study: Secondary | ICD-10-CM | POA: Diagnosis not present

## 2021-07-10 DIAGNOSIS — I10 Essential (primary) hypertension: Secondary | ICD-10-CM | POA: Diagnosis not present

## 2021-07-10 DIAGNOSIS — Z7982 Long term (current) use of aspirin: Secondary | ICD-10-CM | POA: Diagnosis not present

## 2021-07-10 DIAGNOSIS — I2510 Atherosclerotic heart disease of native coronary artery without angina pectoris: Secondary | ICD-10-CM | POA: Diagnosis not present

## 2021-07-10 DIAGNOSIS — I2582 Chronic total occlusion of coronary artery: Secondary | ICD-10-CM | POA: Diagnosis not present

## 2021-07-10 DIAGNOSIS — Z951 Presence of aortocoronary bypass graft: Secondary | ICD-10-CM | POA: Diagnosis not present

## 2021-07-10 DIAGNOSIS — Z87891 Personal history of nicotine dependence: Secondary | ICD-10-CM | POA: Diagnosis not present

## 2021-07-14 DIAGNOSIS — Z23 Encounter for immunization: Secondary | ICD-10-CM | POA: Diagnosis not present

## 2021-08-01 DIAGNOSIS — I10 Essential (primary) hypertension: Secondary | ICD-10-CM | POA: Diagnosis not present

## 2021-08-01 DIAGNOSIS — I350 Nonrheumatic aortic (valve) stenosis: Secondary | ICD-10-CM | POA: Diagnosis not present

## 2021-08-01 DIAGNOSIS — E782 Mixed hyperlipidemia: Secondary | ICD-10-CM | POA: Diagnosis not present

## 2021-08-01 DIAGNOSIS — I2510 Atherosclerotic heart disease of native coronary artery without angina pectoris: Secondary | ICD-10-CM | POA: Diagnosis not present

## 2021-08-01 DIAGNOSIS — Z951 Presence of aortocoronary bypass graft: Secondary | ICD-10-CM | POA: Diagnosis not present

## 2021-08-01 DIAGNOSIS — Z7982 Long term (current) use of aspirin: Secondary | ICD-10-CM | POA: Diagnosis not present

## 2021-08-01 DIAGNOSIS — C9110 Chronic lymphocytic leukemia of B-cell type not having achieved remission: Secondary | ICD-10-CM | POA: Diagnosis not present

## 2021-11-01 ENCOUNTER — Telehealth: Payer: Self-pay | Admitting: Oncology

## 2021-11-01 NOTE — Telephone Encounter (Signed)
Per 11/01/21 spoke with patient to reschedule an appt.Patient stated he did not want to rechedule at this time.He stated he was seeing someone else.

## 2021-11-03 NOTE — Progress Notes (Signed)
[facility]  [address] [place],    [address] [phone]  Clinic Day:  11/06/2021  Referring physician: Venetia Maxon, [name], *  This document serves as a record of services personally performed by Marice Potter, MD. It was created on their behalf by Curry,Lauren E, a trained medical scribe. The creation of this record is based on the scribe's personal observations and the provider's statements to them.  HISTORY OF PRESENT ILLNESS:  The patient is a 81 y.o. male with a history of  B-cell lymphoproliferative disorder, whose immunophenotypic profile resembles CLL.  This was diagnosed per flow cytometry of his peripheral blood in July 2018.  However, as there were not 5000 clonal white cells seen per his flow cytometry, he has not officially been given the diagnosis of chronic lymphocytic leukemia.  The patient comes in today for routine follow up.  Since his last, he has been doing well.  He denies having any B symptoms or bulky lymphadenopathy which concerns him for his B-cell lymphoproliferative disorder transforming into a more aggressive hematologic malignancy.    VITALS:  Blood pressure (!) 192/86, pulse 60, temperature 98.3 F (36.8 C), resp. rate 16, height 5\' 7"  (1.702 m), weight 196 lb 6.4 oz (89.1 kg), SpO2 100 %.  Wt Readings from Last 3 Encounters:  11/06/21 196 lb 6.4 oz (89.1 kg)  05/23/21 197 lb 9.6 oz (89.6 kg)  11/08/20 200 lb 12.8 oz (91.1 kg)    Body mass index is 30.76 kg/m.  Performance status (ECOG): 0 - Asymptomatic  PHYSICAL EXAM:  Physical Exam Constitutional:      General: He is not in acute distress.    Appearance: Normal appearance. He is normal weight. He is not ill-appearing, toxic-appearing or diaphoretic.  HENT:     Head: Normocephalic and atraumatic.     Right Ear: Tympanic membrane normal.     Left Ear: Tympanic membrane normal.     Nose: Nose normal. No congestion or rhinorrhea.     Mouth/Throat:      Mouth: Mucous membranes are moist.     Pharynx: Oropharynx is clear. No oropharyngeal exudate or posterior oropharyngeal erythema.  Eyes:     General: No scleral icterus.       Right eye: No discharge.        Left eye: No discharge.     Extraocular Movements: Extraocular movements intact.     Conjunctiva/sclera: Conjunctivae normal.     Pupils: Pupils are equal, round, and reactive to light.  Neck:     Vascular: No carotid bruit.  Cardiovascular:     Rate and Rhythm: Normal rate and regular rhythm.     Heart sounds: No murmur heard.   No friction rub. No gallop.  Pulmonary:     Effort: Pulmonary effort is normal. No respiratory distress.     Breath sounds: Normal breath sounds. No stridor. No wheezing, rhonchi or rales.  Chest:     Chest wall: No tenderness.  Abdominal:     General: Abdomen is flat. Bowel sounds are normal. There is no distension.     Palpations: There is no mass.     Tenderness: There is no abdominal tenderness. There is no right CVA tenderness, left CVA tenderness, guarding or rebound.     Hernia: No hernia is present.  Musculoskeletal:        General: No swelling, tenderness, deformity or signs of injury. Normal range of motion.     Cervical back: Normal  range of motion and neck supple. No rigidity or tenderness.     Right lower leg: No edema.     Left lower leg: No edema.  Lymphadenopathy:     Cervical: No cervical adenopathy.  Skin:    General: Skin is warm and dry.     Capillary Refill: Capillary refill takes less than 2 seconds.     Coloration: Skin is not jaundiced or pale.     Findings: No bruising, erythema, lesion or rash.  Neurological:     General: No focal deficit present.     Mental Status: He is alert and oriented to person, place, and time. Mental status is at baseline.     Cranial Nerves: No cranial nerve deficit.     Sensory: No sensory deficit.     Motor: No weakness.     Coordination: Coordination normal.     Gait: Gait normal.      Deep Tendon Reflexes: Reflexes normal.  Psychiatric:        Mood and Affect: Mood normal.        Behavior: Behavior normal.        Thought Content: Thought content normal.        Judgment: Judgment normal.    LABS:    CMP Latest Ref Rng & Units 11/06/2021 11/08/2020 08/03/2019  Glucose 65 - 99 mg/dL - - 99  BUN 4 - 21 15 17 12   Creatinine 0.6 - 1.3 1.2 1.1 1.19  Sodium 137 - 147 138 135(A) 141  Potassium 3.4 - 5.3 4.5 4.3 4.7  Chloride 99 - 108 106 103 103  CO2 13 - 22 27(A) 26(A) 23  Calcium 8.7 - 10.7 9.4 9.2 9.9  Total Protein 6.0 - 8.5 g/dL - - -  Total Bilirubin 0.0 - 1.2 mg/dL - - -  Alkaline Phos 25 - 125 62 58 -  AST 14 - 40 24 26 -  ALT 10 - 40 20 18 -   ASSESSMENT & PLAN:  Assessment:  The patient is a 81 y.o. male with a history of B-cell lymphoproliferative disorder.  When evaluating his labs today, this gentleman's white count is normal.  He still has a predominance of lymphocytes, which highlights that his B-cell lymphoproliferative disorder is still present.  Nevertheless, this disorder remains quiescent.  From a hematologic standpoint, this patient continues to do well. As that is the case, I will see him back in 1 year for repeat clinical assessment.   The patient understands the plans discussed today and is in agreement with them.    I, Rita Ohara, am acting as scribe for Marice Potter, MD    I have reviewed this report as typed by the medical scribe, and it is complete and accurate.  Isador Castille Macarthur Critchley, MD

## 2021-11-06 ENCOUNTER — Other Ambulatory Visit: Payer: Self-pay | Admitting: Hematology and Oncology

## 2021-11-06 ENCOUNTER — Other Ambulatory Visit: Payer: Self-pay

## 2021-11-06 ENCOUNTER — Other Ambulatory Visit: Payer: Self-pay | Admitting: Oncology

## 2021-11-06 ENCOUNTER — Inpatient Hospital Stay: Payer: Medicare Other | Attending: Oncology | Admitting: Oncology

## 2021-11-06 ENCOUNTER — Inpatient Hospital Stay: Payer: Medicare Other

## 2021-11-06 VITALS — BP 192/86 | HR 60 | Temp 98.3°F | Resp 16 | Ht 67.0 in | Wt 196.4 lb

## 2021-11-06 DIAGNOSIS — D509 Iron deficiency anemia, unspecified: Secondary | ICD-10-CM | POA: Insufficient documentation

## 2021-11-06 DIAGNOSIS — D47Z9 Other specified neoplasms of uncertain behavior of lymphoid, hematopoietic and related tissue: Secondary | ICD-10-CM | POA: Insufficient documentation

## 2021-11-06 DIAGNOSIS — C9110 Chronic lymphocytic leukemia of B-cell type not having achieved remission: Secondary | ICD-10-CM

## 2021-11-06 LAB — CBC AND DIFFERENTIAL
HCT: 40 — AB (ref 41–53)
Hemoglobin: 13.6 (ref 13.5–17.5)
Neutrophils Absolute: 2.55
Platelets: 186 (ref 150–399)
WBC: 8.8

## 2021-11-06 LAB — CBC
Absolute Lymphocytes: 5.98 — AB (ref 0.065–4.75)
MCV: 89 (ref 80–94)
RBC: 4.54 (ref 3.87–5.11)

## 2021-11-06 LAB — BASIC METABOLIC PANEL
BUN: 15 (ref 4–21)
CO2: 27 — AB (ref 13–22)
Chloride: 106 (ref 99–108)
Creatinine: 1.2 (ref 0.6–1.3)
Glucose: 108
Potassium: 4.5 (ref 3.4–5.3)
Sodium: 138 (ref 137–147)

## 2021-11-06 LAB — IRON AND TIBC
Iron: 70 ug/dL (ref 45–182)
Saturation Ratios: 16 % — ABNORMAL LOW (ref 17.9–39.5)
TIBC: 433 ug/dL (ref 250–450)
UIBC: 363 ug/dL

## 2021-11-06 LAB — COMPREHENSIVE METABOLIC PANEL
Albumin: 4.3 (ref 3.5–5.0)
Calcium: 9.4 (ref 8.7–10.7)

## 2021-11-06 LAB — HEPATIC FUNCTION PANEL
ALT: 20 (ref 10–40)
AST: 24 (ref 14–40)
Alkaline Phosphatase: 62 (ref 25–125)
Bilirubin, Total: 1.1

## 2021-11-06 LAB — FERRITIN: Ferritin: 37 ng/mL (ref 24–336)

## 2021-11-08 ENCOUNTER — Ambulatory Visit: Payer: Medicare Other | Admitting: Oncology

## 2021-11-08 ENCOUNTER — Other Ambulatory Visit: Payer: Medicare Other

## 2021-11-21 DIAGNOSIS — R051 Acute cough: Secondary | ICD-10-CM | POA: Diagnosis not present

## 2021-11-21 DIAGNOSIS — R0982 Postnasal drip: Secondary | ICD-10-CM | POA: Diagnosis not present

## 2021-11-21 DIAGNOSIS — J309 Allergic rhinitis, unspecified: Secondary | ICD-10-CM | POA: Diagnosis not present

## 2021-11-28 ENCOUNTER — Ambulatory Visit: Payer: Medicare Other | Admitting: Cardiology

## 2022-05-22 DIAGNOSIS — E039 Hypothyroidism, unspecified: Secondary | ICD-10-CM | POA: Diagnosis not present

## 2022-05-22 DIAGNOSIS — E785 Hyperlipidemia, unspecified: Secondary | ICD-10-CM | POA: Diagnosis not present

## 2022-05-22 DIAGNOSIS — C9110 Chronic lymphocytic leukemia of B-cell type not having achieved remission: Secondary | ICD-10-CM | POA: Diagnosis not present

## 2022-05-28 DIAGNOSIS — Z951 Presence of aortocoronary bypass graft: Secondary | ICD-10-CM | POA: Diagnosis not present

## 2022-05-28 DIAGNOSIS — E785 Hyperlipidemia, unspecified: Secondary | ICD-10-CM | POA: Diagnosis not present

## 2022-05-28 DIAGNOSIS — Z955 Presence of coronary angioplasty implant and graft: Secondary | ICD-10-CM | POA: Diagnosis not present

## 2022-05-28 DIAGNOSIS — E039 Hypothyroidism, unspecified: Secondary | ICD-10-CM | POA: Diagnosis not present

## 2022-05-28 DIAGNOSIS — K2960 Other gastritis without bleeding: Secondary | ICD-10-CM | POA: Diagnosis not present

## 2022-05-28 DIAGNOSIS — Z9889 Other specified postprocedural states: Secondary | ICD-10-CM | POA: Diagnosis not present

## 2022-05-28 DIAGNOSIS — R011 Cardiac murmur, unspecified: Secondary | ICD-10-CM | POA: Diagnosis not present

## 2022-05-28 DIAGNOSIS — I252 Old myocardial infarction: Secondary | ICD-10-CM | POA: Diagnosis not present

## 2022-05-28 DIAGNOSIS — I10 Essential (primary) hypertension: Secondary | ICD-10-CM | POA: Diagnosis not present

## 2022-05-28 DIAGNOSIS — I25119 Atherosclerotic heart disease of native coronary artery with unspecified angina pectoris: Secondary | ICD-10-CM | POA: Diagnosis not present

## 2022-05-28 DIAGNOSIS — C9110 Chronic lymphocytic leukemia of B-cell type not having achieved remission: Secondary | ICD-10-CM | POA: Diagnosis not present

## 2022-05-28 DIAGNOSIS — Z0000 Encounter for general adult medical examination without abnormal findings: Secondary | ICD-10-CM | POA: Diagnosis not present

## 2022-07-05 DIAGNOSIS — E785 Hyperlipidemia, unspecified: Secondary | ICD-10-CM | POA: Diagnosis not present

## 2022-07-05 DIAGNOSIS — I2510 Atherosclerotic heart disease of native coronary artery without angina pectoris: Secondary | ICD-10-CM | POA: Diagnosis not present

## 2022-07-05 DIAGNOSIS — Z7982 Long term (current) use of aspirin: Secondary | ICD-10-CM | POA: Diagnosis not present

## 2022-07-05 DIAGNOSIS — Z87891 Personal history of nicotine dependence: Secondary | ICD-10-CM | POA: Diagnosis not present

## 2022-07-05 DIAGNOSIS — Z7902 Long term (current) use of antithrombotics/antiplatelets: Secondary | ICD-10-CM | POA: Diagnosis not present

## 2022-07-05 DIAGNOSIS — I10 Essential (primary) hypertension: Secondary | ICD-10-CM | POA: Diagnosis not present

## 2022-07-05 DIAGNOSIS — E782 Mixed hyperlipidemia: Secondary | ICD-10-CM | POA: Diagnosis not present

## 2022-07-05 DIAGNOSIS — I6523 Occlusion and stenosis of bilateral carotid arteries: Secondary | ICD-10-CM | POA: Diagnosis not present

## 2022-07-12 DIAGNOSIS — Z23 Encounter for immunization: Secondary | ICD-10-CM | POA: Diagnosis not present

## 2022-08-07 DIAGNOSIS — E782 Mixed hyperlipidemia: Secondary | ICD-10-CM | POA: Diagnosis not present

## 2022-08-07 DIAGNOSIS — Z951 Presence of aortocoronary bypass graft: Secondary | ICD-10-CM | POA: Diagnosis not present

## 2022-08-07 DIAGNOSIS — I350 Nonrheumatic aortic (valve) stenosis: Secondary | ICD-10-CM | POA: Diagnosis not present

## 2022-08-07 DIAGNOSIS — C9110 Chronic lymphocytic leukemia of B-cell type not having achieved remission: Secondary | ICD-10-CM | POA: Diagnosis not present

## 2022-08-07 DIAGNOSIS — I2510 Atherosclerotic heart disease of native coronary artery without angina pectoris: Secondary | ICD-10-CM | POA: Diagnosis not present

## 2022-08-07 DIAGNOSIS — Z7982 Long term (current) use of aspirin: Secondary | ICD-10-CM | POA: Diagnosis not present

## 2022-08-07 DIAGNOSIS — I10 Essential (primary) hypertension: Secondary | ICD-10-CM | POA: Diagnosis not present

## 2022-09-10 DIAGNOSIS — I2510 Atherosclerotic heart disease of native coronary artery without angina pectoris: Secondary | ICD-10-CM | POA: Diagnosis not present

## 2022-09-10 DIAGNOSIS — Z951 Presence of aortocoronary bypass graft: Secondary | ICD-10-CM | POA: Diagnosis not present

## 2022-09-10 DIAGNOSIS — I350 Nonrheumatic aortic (valve) stenosis: Secondary | ICD-10-CM | POA: Diagnosis not present

## 2022-09-12 DIAGNOSIS — H9202 Otalgia, left ear: Secondary | ICD-10-CM | POA: Diagnosis not present

## 2022-10-04 DIAGNOSIS — C9110 Chronic lymphocytic leukemia of B-cell type not having achieved remission: Secondary | ICD-10-CM | POA: Diagnosis not present

## 2022-10-04 DIAGNOSIS — I25119 Atherosclerotic heart disease of native coronary artery with unspecified angina pectoris: Secondary | ICD-10-CM | POA: Diagnosis not present

## 2022-10-04 DIAGNOSIS — H8113 Benign paroxysmal vertigo, bilateral: Secondary | ICD-10-CM | POA: Diagnosis not present

## 2022-10-04 DIAGNOSIS — I10 Essential (primary) hypertension: Secondary | ICD-10-CM | POA: Diagnosis not present

## 2022-10-31 DIAGNOSIS — C44311 Basal cell carcinoma of skin of nose: Secondary | ICD-10-CM | POA: Diagnosis not present

## 2022-11-05 NOTE — Progress Notes (Unsigned)
[facility]  [address] [place],  [place]  [address] [phone]  Clinic Day:  11/06/2022  Referring physician: Emmaline Kluver, *  HISTORY OF PRESENT ILLNESS:  The patient is a 82 y.o. male with a history of  B-cell lymphoproliferative disorder, whose immunophenotypic profile resembles CLL.  This was diagnosed per flow cytometry of his peripheral blood in July 2018.  However, as there were not 5000 clonal white cells seen per his flow cytometry, he has not officially been given the diagnosis of chronic lymphocytic leukemia.  The patient comes in today for routine follow up.  Since his last, he has been doing well.  He denies having any B symptoms or bulky lymphadenopathy which concerns him for his B-cell lymphoproliferative disorder transforming into a more aggressive hematologic malignancy.    VITALS:  Blood pressure (!) 213/87, pulse 62, temperature 98.4 F (36.9 C), resp. rate 16, height '5\' 7"'$  (1.702 m), weight 195 lb 8 oz (88.7 kg), SpO2 96 %.  Wt Readings from Last 3 Encounters:  11/06/22 195 lb 8 oz (88.7 kg)  11/06/21 196 lb 6.4 oz (89.1 kg)  05/23/21 197 lb 9.6 oz (89.6 kg)    Body mass index is 30.62 kg/m.  Performance status (ECOG): 0 - Asymptomatic  PHYSICAL EXAM:  Physical Exam Constitutional:      General: He is not in acute distress.    Appearance: Normal appearance. He is normal weight. He is not ill-appearing, toxic-appearing or diaphoretic.  HENT:     Head: Normocephalic and atraumatic.     Right Ear: Tympanic membrane normal.     Left Ear: Tympanic membrane normal.     Nose: Nose normal. No congestion or rhinorrhea.     Mouth/Throat:     Mouth: Mucous membranes are moist.     Pharynx: Oropharynx is clear. No oropharyngeal exudate or posterior oropharyngeal erythema.  Eyes:     General: No scleral icterus.       Right eye: No discharge.        Left eye: No discharge.     Extraocular Movements: Extraocular movements  intact.     Conjunctiva/sclera: Conjunctivae normal.     Pupils: Pupils are equal, round, and reactive to light.  Neck:     Vascular: No carotid bruit.  Cardiovascular:     Rate and Rhythm: Normal rate and regular rhythm.     Heart sounds: No murmur heard.    No friction rub. No gallop.  Pulmonary:     Effort: Pulmonary effort is normal. No respiratory distress.     Breath sounds: Normal breath sounds. No stridor. No wheezing, rhonchi or rales.  Chest:     Chest wall: No tenderness.  Abdominal:     General: Abdomen is flat. Bowel sounds are normal. There is no distension.     Palpations: There is no mass.     Tenderness: There is no abdominal tenderness. There is no right CVA tenderness, left CVA tenderness, guarding or rebound.     Hernia: No hernia is present.  Musculoskeletal:        General: No swelling, tenderness, deformity or signs of injury. Normal range of motion.     Cervical back: Normal range of motion and neck supple. No rigidity or tenderness.     Right lower leg: No edema.     Left lower leg: No edema.  Lymphadenopathy:     Cervical: No cervical adenopathy.  Skin:    General: Skin is  warm and dry.     Capillary Refill: Capillary refill takes less than 2 seconds.     Coloration: Skin is not jaundiced or pale.     Findings: No bruising, erythema, lesion or rash.  Neurological:     General: No focal deficit present.     Mental Status: He is alert and oriented to person, place, and time. Mental status is at baseline.     Cranial Nerves: No cranial nerve deficit.     Sensory: No sensory deficit.     Motor: No weakness.     Coordination: Coordination normal.     Gait: Gait normal.     Deep Tendon Reflexes: Reflexes normal.  Psychiatric:        Mood and Affect: Mood normal.        Behavior: Behavior normal.        Thought Content: Thought content normal.        Judgment: Judgment normal.    LABS:    ASSESSMENT & PLAN:  An 82 y.o. male with a history of  B-cell lymphoproliferative disorder.  When evaluating his labs today, this gentleman's white count is higher when compared to last year.  However, part of this may be due to a Kenalog injection he received a few weeks ago.  Despite this, his hemoglobin and platelets remain at ideal levels.  Based upon his counts today, he theoretically qualifies for having CLL.  Nevertheless, this disease remains quiescent.  From a hematologic standpoint, this patient continues to do well. As that is the case, I will see him back in 1 year for repeat clinical assessment.   The patient understands the plans discussed today and is in agreement with them.    Winfrey Chillemi Macarthur Critchley, MD

## 2022-11-06 ENCOUNTER — Telehealth: Payer: Self-pay | Admitting: Oncology

## 2022-11-06 ENCOUNTER — Inpatient Hospital Stay: Payer: Medicare Other

## 2022-11-06 ENCOUNTER — Inpatient Hospital Stay: Payer: Medicare Other | Attending: Oncology | Admitting: Oncology

## 2022-11-06 ENCOUNTER — Other Ambulatory Visit: Payer: Self-pay | Admitting: Oncology

## 2022-11-06 VITALS — BP 213/87 | HR 62 | Temp 98.4°F | Resp 16 | Ht 67.0 in | Wt 195.5 lb

## 2022-11-06 DIAGNOSIS — D47Z9 Other specified neoplasms of uncertain behavior of lymphoid, hematopoietic and related tissue: Secondary | ICD-10-CM

## 2022-11-06 DIAGNOSIS — C9110 Chronic lymphocytic leukemia of B-cell type not having achieved remission: Secondary | ICD-10-CM

## 2022-11-06 LAB — CBC AND DIFFERENTIAL
HCT: 43 (ref 41–53)
Hemoglobin: 14.3 (ref 13.5–17.5)
Neutrophils Absolute: 3.4
Platelets: 194 10*3/uL (ref 150–400)
WBC: 13.6

## 2022-11-06 LAB — CBC: RBC: 4.67 (ref 3.87–5.11)

## 2022-11-06 NOTE — Telephone Encounter (Signed)
Patient has been scheduled for follow-up visit per 11/06/22 LOS.  Pt given an appt calendar with date and time.

## 2023-02-21 DIAGNOSIS — R070 Pain in throat: Secondary | ICD-10-CM | POA: Diagnosis not present

## 2023-02-21 DIAGNOSIS — J22 Unspecified acute lower respiratory infection: Secondary | ICD-10-CM | POA: Diagnosis not present

## 2023-02-21 DIAGNOSIS — R509 Fever, unspecified: Secondary | ICD-10-CM | POA: Diagnosis not present

## 2023-05-01 DIAGNOSIS — L255 Unspecified contact dermatitis due to plants, except food: Secondary | ICD-10-CM | POA: Diagnosis not present

## 2023-05-24 DIAGNOSIS — E785 Hyperlipidemia, unspecified: Secondary | ICD-10-CM | POA: Diagnosis not present

## 2023-05-24 DIAGNOSIS — Z79899 Other long term (current) drug therapy: Secondary | ICD-10-CM | POA: Diagnosis not present

## 2023-05-24 DIAGNOSIS — E039 Hypothyroidism, unspecified: Secondary | ICD-10-CM | POA: Diagnosis not present

## 2023-05-30 DIAGNOSIS — I252 Old myocardial infarction: Secondary | ICD-10-CM | POA: Diagnosis not present

## 2023-05-30 DIAGNOSIS — Z955 Presence of coronary angioplasty implant and graft: Secondary | ICD-10-CM | POA: Diagnosis not present

## 2023-05-30 DIAGNOSIS — J3089 Other allergic rhinitis: Secondary | ICD-10-CM | POA: Diagnosis not present

## 2023-05-30 DIAGNOSIS — C9110 Chronic lymphocytic leukemia of B-cell type not having achieved remission: Secondary | ICD-10-CM | POA: Diagnosis not present

## 2023-05-30 DIAGNOSIS — I10 Essential (primary) hypertension: Secondary | ICD-10-CM | POA: Diagnosis not present

## 2023-05-30 DIAGNOSIS — Z951 Presence of aortocoronary bypass graft: Secondary | ICD-10-CM | POA: Diagnosis not present

## 2023-05-30 DIAGNOSIS — Z0000 Encounter for general adult medical examination without abnormal findings: Secondary | ICD-10-CM | POA: Diagnosis not present

## 2023-05-30 DIAGNOSIS — E039 Hypothyroidism, unspecified: Secondary | ICD-10-CM | POA: Diagnosis not present

## 2023-05-30 DIAGNOSIS — I25119 Atherosclerotic heart disease of native coronary artery with unspecified angina pectoris: Secondary | ICD-10-CM | POA: Diagnosis not present

## 2023-05-30 DIAGNOSIS — E785 Hyperlipidemia, unspecified: Secondary | ICD-10-CM | POA: Diagnosis not present

## 2023-05-30 DIAGNOSIS — Z9889 Other specified postprocedural states: Secondary | ICD-10-CM | POA: Diagnosis not present

## 2023-05-30 DIAGNOSIS — K2960 Other gastritis without bleeding: Secondary | ICD-10-CM | POA: Diagnosis not present

## 2023-06-16 DIAGNOSIS — J069 Acute upper respiratory infection, unspecified: Secondary | ICD-10-CM | POA: Diagnosis not present

## 2023-06-16 DIAGNOSIS — H9203 Otalgia, bilateral: Secondary | ICD-10-CM | POA: Diagnosis not present

## 2023-06-24 DIAGNOSIS — E782 Mixed hyperlipidemia: Secondary | ICD-10-CM | POA: Diagnosis not present

## 2023-06-24 DIAGNOSIS — C9110 Chronic lymphocytic leukemia of B-cell type not having achieved remission: Secondary | ICD-10-CM | POA: Diagnosis not present

## 2023-06-24 DIAGNOSIS — Z951 Presence of aortocoronary bypass graft: Secondary | ICD-10-CM | POA: Diagnosis not present

## 2023-06-24 DIAGNOSIS — I2510 Atherosclerotic heart disease of native coronary artery without angina pectoris: Secondary | ICD-10-CM | POA: Diagnosis not present

## 2023-06-24 DIAGNOSIS — I6529 Occlusion and stenosis of unspecified carotid artery: Secondary | ICD-10-CM | POA: Diagnosis not present

## 2023-06-24 DIAGNOSIS — I350 Nonrheumatic aortic (valve) stenosis: Secondary | ICD-10-CM | POA: Diagnosis not present

## 2023-07-05 DIAGNOSIS — Z23 Encounter for immunization: Secondary | ICD-10-CM | POA: Diagnosis not present

## 2023-07-08 DIAGNOSIS — E782 Mixed hyperlipidemia: Secondary | ICD-10-CM | POA: Diagnosis not present

## 2023-07-08 DIAGNOSIS — I6523 Occlusion and stenosis of bilateral carotid arteries: Secondary | ICD-10-CM | POA: Diagnosis not present

## 2023-07-08 DIAGNOSIS — Z951 Presence of aortocoronary bypass graft: Secondary | ICD-10-CM | POA: Diagnosis not present

## 2023-07-08 DIAGNOSIS — I10 Essential (primary) hypertension: Secondary | ICD-10-CM | POA: Diagnosis not present

## 2023-07-08 DIAGNOSIS — Z7982 Long term (current) use of aspirin: Secondary | ICD-10-CM | POA: Diagnosis not present

## 2023-08-08 DIAGNOSIS — L03116 Cellulitis of left lower limb: Secondary | ICD-10-CM | POA: Diagnosis not present

## 2023-08-13 ENCOUNTER — Telehealth: Payer: Self-pay

## 2023-08-13 NOTE — Telephone Encounter (Addendum)
Pt is being treated for cellulitis on his leg. He wants to know if the cellulitis is an indication that his CLL is coming back? Should he just continue to f/u with his PCP or Dr Melvyn Neth to see? I notified Dr Melvyn Neth of above.

## 2023-08-14 DIAGNOSIS — L03116 Cellulitis of left lower limb: Secondary | ICD-10-CM | POA: Diagnosis not present

## 2023-08-23 DIAGNOSIS — M79662 Pain in left lower leg: Secondary | ICD-10-CM | POA: Diagnosis not present

## 2023-08-28 DIAGNOSIS — L03116 Cellulitis of left lower limb: Secondary | ICD-10-CM | POA: Diagnosis not present

## 2023-09-02 DIAGNOSIS — L03116 Cellulitis of left lower limb: Secondary | ICD-10-CM | POA: Diagnosis not present

## 2023-09-09 DIAGNOSIS — E039 Hypothyroidism, unspecified: Secondary | ICD-10-CM | POA: Diagnosis not present

## 2023-09-09 DIAGNOSIS — Z87891 Personal history of nicotine dependence: Secondary | ICD-10-CM | POA: Diagnosis not present

## 2023-09-09 DIAGNOSIS — I872 Venous insufficiency (chronic) (peripheral): Secondary | ICD-10-CM | POA: Diagnosis not present

## 2023-09-09 DIAGNOSIS — Z7902 Long term (current) use of antithrombotics/antiplatelets: Secondary | ICD-10-CM | POA: Diagnosis not present

## 2023-09-09 DIAGNOSIS — Z951 Presence of aortocoronary bypass graft: Secondary | ICD-10-CM | POA: Diagnosis not present

## 2023-09-09 DIAGNOSIS — I10 Essential (primary) hypertension: Secondary | ICD-10-CM | POA: Diagnosis not present

## 2023-09-09 DIAGNOSIS — B9562 Methicillin resistant Staphylococcus aureus infection as the cause of diseases classified elsewhere: Secondary | ICD-10-CM | POA: Diagnosis not present

## 2023-09-09 DIAGNOSIS — Z79899 Other long term (current) drug therapy: Secondary | ICD-10-CM | POA: Diagnosis not present

## 2023-09-09 DIAGNOSIS — M7989 Other specified soft tissue disorders: Secondary | ICD-10-CM | POA: Diagnosis not present

## 2023-09-09 DIAGNOSIS — I2510 Atherosclerotic heart disease of native coronary artery without angina pectoris: Secondary | ICD-10-CM | POA: Diagnosis not present

## 2023-09-09 DIAGNOSIS — Z856 Personal history of leukemia: Secondary | ICD-10-CM | POA: Diagnosis not present

## 2023-09-09 DIAGNOSIS — E7800 Pure hypercholesterolemia, unspecified: Secondary | ICD-10-CM | POA: Diagnosis not present

## 2023-09-09 DIAGNOSIS — M79604 Pain in right leg: Secondary | ICD-10-CM | POA: Diagnosis not present

## 2023-09-09 DIAGNOSIS — L03116 Cellulitis of left lower limb: Secondary | ICD-10-CM | POA: Diagnosis not present

## 2023-09-09 DIAGNOSIS — K219 Gastro-esophageal reflux disease without esophagitis: Secondary | ICD-10-CM | POA: Diagnosis not present

## 2023-09-09 DIAGNOSIS — Z888 Allergy status to other drugs, medicaments and biological substances status: Secondary | ICD-10-CM | POA: Diagnosis not present

## 2023-09-09 DIAGNOSIS — M79605 Pain in left leg: Secondary | ICD-10-CM | POA: Diagnosis not present

## 2023-09-09 DIAGNOSIS — Z7982 Long term (current) use of aspirin: Secondary | ICD-10-CM | POA: Diagnosis not present

## 2023-09-16 DIAGNOSIS — I2510 Atherosclerotic heart disease of native coronary artery without angina pectoris: Secondary | ICD-10-CM | POA: Diagnosis not present

## 2023-09-16 DIAGNOSIS — E7800 Pure hypercholesterolemia, unspecified: Secondary | ICD-10-CM | POA: Diagnosis not present

## 2023-09-16 DIAGNOSIS — L03116 Cellulitis of left lower limb: Secondary | ICD-10-CM | POA: Diagnosis not present

## 2023-09-16 DIAGNOSIS — E039 Hypothyroidism, unspecified: Secondary | ICD-10-CM | POA: Diagnosis not present

## 2023-09-16 DIAGNOSIS — Z856 Personal history of leukemia: Secondary | ICD-10-CM | POA: Diagnosis not present

## 2023-09-16 DIAGNOSIS — G9689 Other specified disorders of central nervous system: Secondary | ICD-10-CM | POA: Diagnosis not present

## 2023-09-16 DIAGNOSIS — K8020 Calculus of gallbladder without cholecystitis without obstruction: Secondary | ICD-10-CM | POA: Diagnosis not present

## 2023-09-16 DIAGNOSIS — I25119 Atherosclerotic heart disease of native coronary artery with unspecified angina pectoris: Secondary | ICD-10-CM | POA: Diagnosis not present

## 2023-09-16 DIAGNOSIS — Z955 Presence of coronary angioplasty implant and graft: Secondary | ICD-10-CM | POA: Diagnosis not present

## 2023-09-16 DIAGNOSIS — I878 Other specified disorders of veins: Secondary | ICD-10-CM | POA: Diagnosis not present

## 2023-09-16 DIAGNOSIS — Z87891 Personal history of nicotine dependence: Secondary | ICD-10-CM | POA: Diagnosis not present

## 2023-09-16 DIAGNOSIS — I10 Essential (primary) hypertension: Secondary | ICD-10-CM | POA: Diagnosis not present

## 2023-09-16 DIAGNOSIS — L03119 Cellulitis of unspecified part of limb: Secondary | ICD-10-CM | POA: Diagnosis not present

## 2023-09-16 DIAGNOSIS — K219 Gastro-esophageal reflux disease without esophagitis: Secondary | ICD-10-CM | POA: Diagnosis not present

## 2023-09-16 DIAGNOSIS — Z952 Presence of prosthetic heart valve: Secondary | ICD-10-CM | POA: Diagnosis not present

## 2023-09-16 DIAGNOSIS — Z951 Presence of aortocoronary bypass graft: Secondary | ICD-10-CM | POA: Diagnosis not present

## 2023-09-16 DIAGNOSIS — I252 Old myocardial infarction: Secondary | ICD-10-CM | POA: Diagnosis not present

## 2023-09-16 DIAGNOSIS — I872 Venous insufficiency (chronic) (peripheral): Secondary | ICD-10-CM | POA: Diagnosis not present

## 2023-09-19 DIAGNOSIS — K8020 Calculus of gallbladder without cholecystitis without obstruction: Secondary | ICD-10-CM | POA: Diagnosis not present

## 2023-09-19 DIAGNOSIS — I252 Old myocardial infarction: Secondary | ICD-10-CM | POA: Diagnosis not present

## 2023-09-19 DIAGNOSIS — Z955 Presence of coronary angioplasty implant and graft: Secondary | ICD-10-CM | POA: Diagnosis not present

## 2023-09-19 DIAGNOSIS — G9689 Other specified disorders of central nervous system: Secondary | ICD-10-CM | POA: Diagnosis not present

## 2023-09-19 DIAGNOSIS — K219 Gastro-esophageal reflux disease without esophagitis: Secondary | ICD-10-CM | POA: Diagnosis not present

## 2023-09-19 DIAGNOSIS — Z856 Personal history of leukemia: Secondary | ICD-10-CM | POA: Diagnosis not present

## 2023-09-19 DIAGNOSIS — Z87891 Personal history of nicotine dependence: Secondary | ICD-10-CM | POA: Diagnosis not present

## 2023-09-19 DIAGNOSIS — E039 Hypothyroidism, unspecified: Secondary | ICD-10-CM | POA: Diagnosis not present

## 2023-09-19 DIAGNOSIS — Z952 Presence of prosthetic heart valve: Secondary | ICD-10-CM | POA: Diagnosis not present

## 2023-09-19 DIAGNOSIS — E7800 Pure hypercholesterolemia, unspecified: Secondary | ICD-10-CM | POA: Diagnosis not present

## 2023-09-19 DIAGNOSIS — I878 Other specified disorders of veins: Secondary | ICD-10-CM | POA: Diagnosis not present

## 2023-09-19 DIAGNOSIS — I10 Essential (primary) hypertension: Secondary | ICD-10-CM | POA: Diagnosis not present

## 2023-09-19 DIAGNOSIS — I2510 Atherosclerotic heart disease of native coronary artery without angina pectoris: Secondary | ICD-10-CM | POA: Diagnosis not present

## 2023-09-19 DIAGNOSIS — Z951 Presence of aortocoronary bypass graft: Secondary | ICD-10-CM | POA: Diagnosis not present

## 2023-09-19 DIAGNOSIS — L03116 Cellulitis of left lower limb: Secondary | ICD-10-CM | POA: Diagnosis not present

## 2023-09-23 DIAGNOSIS — I252 Old myocardial infarction: Secondary | ICD-10-CM | POA: Diagnosis not present

## 2023-09-23 DIAGNOSIS — K8020 Calculus of gallbladder without cholecystitis without obstruction: Secondary | ICD-10-CM | POA: Diagnosis not present

## 2023-09-23 DIAGNOSIS — K219 Gastro-esophageal reflux disease without esophagitis: Secondary | ICD-10-CM | POA: Diagnosis not present

## 2023-09-23 DIAGNOSIS — E039 Hypothyroidism, unspecified: Secondary | ICD-10-CM | POA: Diagnosis not present

## 2023-09-23 DIAGNOSIS — I878 Other specified disorders of veins: Secondary | ICD-10-CM | POA: Diagnosis not present

## 2023-09-23 DIAGNOSIS — I10 Essential (primary) hypertension: Secondary | ICD-10-CM | POA: Diagnosis not present

## 2023-09-23 DIAGNOSIS — E7800 Pure hypercholesterolemia, unspecified: Secondary | ICD-10-CM | POA: Diagnosis not present

## 2023-09-23 DIAGNOSIS — Z952 Presence of prosthetic heart valve: Secondary | ICD-10-CM | POA: Diagnosis not present

## 2023-09-23 DIAGNOSIS — I2510 Atherosclerotic heart disease of native coronary artery without angina pectoris: Secondary | ICD-10-CM | POA: Diagnosis not present

## 2023-09-23 DIAGNOSIS — G9689 Other specified disorders of central nervous system: Secondary | ICD-10-CM | POA: Diagnosis not present

## 2023-09-23 DIAGNOSIS — Z87891 Personal history of nicotine dependence: Secondary | ICD-10-CM | POA: Diagnosis not present

## 2023-09-23 DIAGNOSIS — Z856 Personal history of leukemia: Secondary | ICD-10-CM | POA: Diagnosis not present

## 2023-09-23 DIAGNOSIS — Z951 Presence of aortocoronary bypass graft: Secondary | ICD-10-CM | POA: Diagnosis not present

## 2023-09-23 DIAGNOSIS — L03116 Cellulitis of left lower limb: Secondary | ICD-10-CM | POA: Diagnosis not present

## 2023-09-23 DIAGNOSIS — Z955 Presence of coronary angioplasty implant and graft: Secondary | ICD-10-CM | POA: Diagnosis not present

## 2023-09-30 DIAGNOSIS — B370 Candidal stomatitis: Secondary | ICD-10-CM | POA: Diagnosis not present

## 2023-09-30 DIAGNOSIS — I10 Essential (primary) hypertension: Secondary | ICD-10-CM | POA: Diagnosis not present

## 2023-09-30 DIAGNOSIS — L03119 Cellulitis of unspecified part of limb: Secondary | ICD-10-CM | POA: Diagnosis not present

## 2023-09-30 DIAGNOSIS — B3781 Candidal esophagitis: Secondary | ICD-10-CM | POA: Diagnosis not present

## 2023-09-30 DIAGNOSIS — I872 Venous insufficiency (chronic) (peripheral): Secondary | ICD-10-CM | POA: Diagnosis not present

## 2023-10-01 DIAGNOSIS — I2510 Atherosclerotic heart disease of native coronary artery without angina pectoris: Secondary | ICD-10-CM | POA: Diagnosis not present

## 2023-10-01 DIAGNOSIS — I252 Old myocardial infarction: Secondary | ICD-10-CM | POA: Diagnosis not present

## 2023-10-01 DIAGNOSIS — I878 Other specified disorders of veins: Secondary | ICD-10-CM | POA: Diagnosis not present

## 2023-10-01 DIAGNOSIS — Z952 Presence of prosthetic heart valve: Secondary | ICD-10-CM | POA: Diagnosis not present

## 2023-10-01 DIAGNOSIS — I10 Essential (primary) hypertension: Secondary | ICD-10-CM | POA: Diagnosis not present

## 2023-10-01 DIAGNOSIS — E7800 Pure hypercholesterolemia, unspecified: Secondary | ICD-10-CM | POA: Diagnosis not present

## 2023-10-01 DIAGNOSIS — G9689 Other specified disorders of central nervous system: Secondary | ICD-10-CM | POA: Diagnosis not present

## 2023-10-01 DIAGNOSIS — Z87891 Personal history of nicotine dependence: Secondary | ICD-10-CM | POA: Diagnosis not present

## 2023-10-01 DIAGNOSIS — Z955 Presence of coronary angioplasty implant and graft: Secondary | ICD-10-CM | POA: Diagnosis not present

## 2023-10-01 DIAGNOSIS — E039 Hypothyroidism, unspecified: Secondary | ICD-10-CM | POA: Diagnosis not present

## 2023-10-01 DIAGNOSIS — Z951 Presence of aortocoronary bypass graft: Secondary | ICD-10-CM | POA: Diagnosis not present

## 2023-10-01 DIAGNOSIS — K8020 Calculus of gallbladder without cholecystitis without obstruction: Secondary | ICD-10-CM | POA: Diagnosis not present

## 2023-10-01 DIAGNOSIS — K219 Gastro-esophageal reflux disease without esophagitis: Secondary | ICD-10-CM | POA: Diagnosis not present

## 2023-10-01 DIAGNOSIS — Z856 Personal history of leukemia: Secondary | ICD-10-CM | POA: Diagnosis not present

## 2023-10-01 DIAGNOSIS — L03116 Cellulitis of left lower limb: Secondary | ICD-10-CM | POA: Diagnosis not present

## 2023-10-10 DIAGNOSIS — I25119 Atherosclerotic heart disease of native coronary artery with unspecified angina pectoris: Secondary | ICD-10-CM | POA: Diagnosis not present

## 2023-10-10 DIAGNOSIS — K219 Gastro-esophageal reflux disease without esophagitis: Secondary | ICD-10-CM | POA: Diagnosis not present

## 2023-10-10 DIAGNOSIS — I2510 Atherosclerotic heart disease of native coronary artery without angina pectoris: Secondary | ICD-10-CM | POA: Diagnosis not present

## 2023-10-10 DIAGNOSIS — L03116 Cellulitis of left lower limb: Secondary | ICD-10-CM | POA: Diagnosis not present

## 2023-10-10 DIAGNOSIS — L03119 Cellulitis of unspecified part of limb: Secondary | ICD-10-CM | POA: Diagnosis not present

## 2023-10-10 DIAGNOSIS — I872 Venous insufficiency (chronic) (peripheral): Secondary | ICD-10-CM | POA: Diagnosis not present

## 2023-10-10 DIAGNOSIS — K2960 Other gastritis without bleeding: Secondary | ICD-10-CM | POA: Diagnosis not present

## 2023-10-10 DIAGNOSIS — I10 Essential (primary) hypertension: Secondary | ICD-10-CM | POA: Diagnosis not present

## 2023-10-10 DIAGNOSIS — Z951 Presence of aortocoronary bypass graft: Secondary | ICD-10-CM | POA: Diagnosis not present

## 2023-10-10 DIAGNOSIS — J209 Acute bronchitis, unspecified: Secondary | ICD-10-CM | POA: Diagnosis not present

## 2023-10-14 DIAGNOSIS — M7989 Other specified soft tissue disorders: Secondary | ICD-10-CM | POA: Diagnosis not present

## 2023-10-14 DIAGNOSIS — M79605 Pain in left leg: Secondary | ICD-10-CM | POA: Diagnosis not present

## 2023-10-14 DIAGNOSIS — Z951 Presence of aortocoronary bypass graft: Secondary | ICD-10-CM | POA: Diagnosis not present

## 2023-10-14 DIAGNOSIS — I872 Venous insufficiency (chronic) (peripheral): Secondary | ICD-10-CM | POA: Diagnosis not present

## 2023-10-14 DIAGNOSIS — K5901 Slow transit constipation: Secondary | ICD-10-CM | POA: Diagnosis not present

## 2023-10-14 DIAGNOSIS — L03119 Cellulitis of unspecified part of limb: Secondary | ICD-10-CM | POA: Diagnosis not present

## 2023-10-14 DIAGNOSIS — I25119 Atherosclerotic heart disease of native coronary artery with unspecified angina pectoris: Secondary | ICD-10-CM | POA: Diagnosis not present

## 2023-10-15 DIAGNOSIS — M79605 Pain in left leg: Secondary | ICD-10-CM | POA: Diagnosis not present

## 2023-10-15 DIAGNOSIS — M7989 Other specified soft tissue disorders: Secondary | ICD-10-CM | POA: Diagnosis not present

## 2023-10-19 DIAGNOSIS — R0902 Hypoxemia: Secondary | ICD-10-CM | POA: Diagnosis not present

## 2023-10-19 DIAGNOSIS — R55 Syncope and collapse: Secondary | ICD-10-CM | POA: Diagnosis not present

## 2023-10-19 DIAGNOSIS — M79603 Pain in arm, unspecified: Secondary | ICD-10-CM | POA: Diagnosis not present

## 2023-10-19 DIAGNOSIS — I499 Cardiac arrhythmia, unspecified: Secondary | ICD-10-CM | POA: Diagnosis not present

## 2023-10-19 DIAGNOSIS — I213 ST elevation (STEMI) myocardial infarction of unspecified site: Secondary | ICD-10-CM | POA: Diagnosis not present

## 2023-10-20 DIAGNOSIS — I2129 ST elevation (STEMI) myocardial infarction involving other sites: Secondary | ICD-10-CM | POA: Diagnosis not present

## 2023-10-20 DIAGNOSIS — K219 Gastro-esophageal reflux disease without esophagitis: Secondary | ICD-10-CM | POA: Diagnosis not present

## 2023-10-20 DIAGNOSIS — I2582 Chronic total occlusion of coronary artery: Secondary | ICD-10-CM | POA: Diagnosis not present

## 2023-10-20 DIAGNOSIS — D709 Neutropenia, unspecified: Secondary | ICD-10-CM | POA: Diagnosis not present

## 2023-10-20 DIAGNOSIS — Z66 Do not resuscitate: Secondary | ICD-10-CM | POA: Diagnosis not present

## 2023-10-20 DIAGNOSIS — I358 Other nonrheumatic aortic valve disorders: Secondary | ICD-10-CM | POA: Diagnosis not present

## 2023-10-20 DIAGNOSIS — D649 Anemia, unspecified: Secondary | ICD-10-CM | POA: Diagnosis not present

## 2023-10-20 DIAGNOSIS — I10 Essential (primary) hypertension: Secondary | ICD-10-CM | POA: Diagnosis not present

## 2023-10-20 DIAGNOSIS — C9110 Chronic lymphocytic leukemia of B-cell type not having achieved remission: Secondary | ICD-10-CM | POA: Diagnosis not present

## 2023-10-20 DIAGNOSIS — J9811 Atelectasis: Secondary | ICD-10-CM | POA: Diagnosis not present

## 2023-10-20 DIAGNOSIS — R0602 Shortness of breath: Secondary | ICD-10-CM | POA: Diagnosis not present

## 2023-10-20 DIAGNOSIS — J811 Chronic pulmonary edema: Secondary | ICD-10-CM | POA: Diagnosis not present

## 2023-10-20 DIAGNOSIS — N179 Acute kidney failure, unspecified: Secondary | ICD-10-CM | POA: Diagnosis not present

## 2023-10-20 DIAGNOSIS — Z4682 Encounter for fitting and adjustment of non-vascular catheter: Secondary | ICD-10-CM | POA: Diagnosis not present

## 2023-10-20 DIAGNOSIS — I2109 ST elevation (STEMI) myocardial infarction involving other coronary artery of anterior wall: Secondary | ICD-10-CM | POA: Diagnosis not present

## 2023-10-20 DIAGNOSIS — Z951 Presence of aortocoronary bypass graft: Secondary | ICD-10-CM | POA: Diagnosis not present

## 2023-10-20 DIAGNOSIS — R0989 Other specified symptoms and signs involving the circulatory and respiratory systems: Secondary | ICD-10-CM | POA: Diagnosis not present

## 2023-10-20 DIAGNOSIS — J9601 Acute respiratory failure with hypoxia: Secondary | ICD-10-CM | POA: Diagnosis not present

## 2023-10-20 DIAGNOSIS — I2510 Atherosclerotic heart disease of native coronary artery without angina pectoris: Secondary | ICD-10-CM | POA: Diagnosis not present

## 2023-10-20 DIAGNOSIS — E785 Hyperlipidemia, unspecified: Secondary | ICD-10-CM | POA: Diagnosis not present

## 2023-10-20 DIAGNOSIS — I509 Heart failure, unspecified: Secondary | ICD-10-CM | POA: Diagnosis not present

## 2023-10-20 DIAGNOSIS — E039 Hypothyroidism, unspecified: Secondary | ICD-10-CM | POA: Diagnosis not present

## 2023-10-20 DIAGNOSIS — K7200 Acute and subacute hepatic failure without coma: Secondary | ICD-10-CM | POA: Diagnosis not present

## 2023-10-20 DIAGNOSIS — Z9889 Other specified postprocedural states: Secondary | ICD-10-CM | POA: Diagnosis not present

## 2023-10-20 DIAGNOSIS — J90 Pleural effusion, not elsewhere classified: Secondary | ICD-10-CM | POA: Diagnosis not present

## 2023-10-20 DIAGNOSIS — I440 Atrioventricular block, first degree: Secondary | ICD-10-CM | POA: Diagnosis not present

## 2023-10-20 DIAGNOSIS — I472 Ventricular tachycardia, unspecified: Secondary | ICD-10-CM | POA: Diagnosis not present

## 2023-10-20 DIAGNOSIS — R0789 Other chest pain: Secondary | ICD-10-CM | POA: Diagnosis not present

## 2023-10-20 DIAGNOSIS — N178 Other acute kidney failure: Secondary | ICD-10-CM | POA: Diagnosis not present

## 2023-10-20 DIAGNOSIS — Z95818 Presence of other cardiac implants and grafts: Secondary | ICD-10-CM | POA: Diagnosis not present

## 2023-10-20 DIAGNOSIS — I213 ST elevation (STEMI) myocardial infarction of unspecified site: Secondary | ICD-10-CM | POA: Diagnosis not present

## 2023-10-20 DIAGNOSIS — D696 Thrombocytopenia, unspecified: Secondary | ICD-10-CM | POA: Diagnosis not present

## 2023-10-20 DIAGNOSIS — I517 Cardiomegaly: Secondary | ICD-10-CM | POA: Diagnosis not present

## 2023-10-20 DIAGNOSIS — J9602 Acute respiratory failure with hypercapnia: Secondary | ICD-10-CM | POA: Diagnosis not present

## 2023-10-20 DIAGNOSIS — I5189 Other ill-defined heart diseases: Secondary | ICD-10-CM | POA: Diagnosis not present

## 2023-10-20 DIAGNOSIS — R570 Cardiogenic shock: Secondary | ICD-10-CM | POA: Diagnosis not present

## 2023-10-20 DIAGNOSIS — Z515 Encounter for palliative care: Secondary | ICD-10-CM | POA: Diagnosis not present

## 2023-10-20 DIAGNOSIS — Z7189 Other specified counseling: Secondary | ICD-10-CM | POA: Diagnosis not present

## 2023-10-21 DIAGNOSIS — N179 Acute kidney failure, unspecified: Secondary | ICD-10-CM | POA: Diagnosis not present

## 2023-10-21 DIAGNOSIS — R570 Cardiogenic shock: Secondary | ICD-10-CM | POA: Diagnosis not present

## 2023-10-21 DIAGNOSIS — J9601 Acute respiratory failure with hypoxia: Secondary | ICD-10-CM | POA: Diagnosis not present

## 2023-10-21 DIAGNOSIS — I472 Ventricular tachycardia, unspecified: Secondary | ICD-10-CM | POA: Diagnosis not present

## 2023-10-21 DIAGNOSIS — J9602 Acute respiratory failure with hypercapnia: Secondary | ICD-10-CM | POA: Diagnosis not present

## 2023-10-21 DIAGNOSIS — K7200 Acute and subacute hepatic failure without coma: Secondary | ICD-10-CM | POA: Diagnosis not present

## 2023-10-21 DIAGNOSIS — I213 ST elevation (STEMI) myocardial infarction of unspecified site: Secondary | ICD-10-CM | POA: Diagnosis not present

## 2023-10-21 DIAGNOSIS — Z7189 Other specified counseling: Secondary | ICD-10-CM | POA: Diagnosis not present

## 2023-10-21 DIAGNOSIS — Z4682 Encounter for fitting and adjustment of non-vascular catheter: Secondary | ICD-10-CM | POA: Diagnosis not present

## 2023-11-02 DEATH — deceased

## 2023-11-07 ENCOUNTER — Other Ambulatory Visit: Payer: Medicare Other

## 2023-11-07 ENCOUNTER — Ambulatory Visit: Payer: Medicare Other | Admitting: Oncology
